# Patient Record
Sex: Female | Born: 1950
Health system: Southern US, Community
[De-identification: ages and names within clinical notes are randomized; demographics above are authoritative.]

## PROBLEM LIST (undated history)

## (undated) ENCOUNTER — Ambulatory Visit (HOSPITAL_COMMUNITY): Admission: EM | Disposition: A | Discharge status: Other Acute Inpatient Hospital | Payer: Medicare Other

## (undated) DIAGNOSIS — F418 Other specified anxiety disorders: Secondary | ICD-10-CM

## (undated) DIAGNOSIS — R040 Epistaxis: Secondary | ICD-10-CM

## (undated) DIAGNOSIS — L0390 Cellulitis, unspecified: Secondary | ICD-10-CM

## (undated) DIAGNOSIS — I2510 Atherosclerotic heart disease of native coronary artery without angina pectoris: Secondary | ICD-10-CM

## (undated) DIAGNOSIS — L209 Atopic dermatitis, unspecified: Secondary | ICD-10-CM

## (undated) DIAGNOSIS — Z72820 Sleep deprivation: Secondary | ICD-10-CM

## (undated) DIAGNOSIS — E039 Hypothyroidism, unspecified: Secondary | ICD-10-CM

## (undated) DIAGNOSIS — J439 Emphysema, unspecified: Secondary | ICD-10-CM

## (undated) DIAGNOSIS — R232 Flushing: Secondary | ICD-10-CM

## (undated) DIAGNOSIS — J309 Allergic rhinitis, unspecified: Secondary | ICD-10-CM

## (undated) DIAGNOSIS — F419 Anxiety disorder, unspecified: Secondary | ICD-10-CM

## (undated) DIAGNOSIS — Z860100 Personal history of colon polyps, unspecified: Secondary | ICD-10-CM

## (undated) DIAGNOSIS — E7800 Pure hypercholesterolemia, unspecified: Secondary | ICD-10-CM

## (undated) DIAGNOSIS — E559 Vitamin D deficiency, unspecified: Secondary | ICD-10-CM

## (undated) DIAGNOSIS — N200 Calculus of kidney: Secondary | ICD-10-CM

## (undated) DIAGNOSIS — R5382 Chronic fatigue, unspecified: Secondary | ICD-10-CM

## (undated) DIAGNOSIS — R600 Localized edema: Secondary | ICD-10-CM

## (undated) DIAGNOSIS — R079 Chest pain, unspecified: Secondary | ICD-10-CM

## (undated) DIAGNOSIS — K219 Gastro-esophageal reflux disease without esophagitis: Secondary | ICD-10-CM

## (undated) DIAGNOSIS — D509 Iron deficiency anemia, unspecified: Secondary | ICD-10-CM

## (undated) DIAGNOSIS — E669 Obesity, unspecified: Secondary | ICD-10-CM

## (undated) DIAGNOSIS — G2581 Restless legs syndrome: Secondary | ICD-10-CM

## (undated) DIAGNOSIS — R7303 Prediabetes: Secondary | ICD-10-CM

## (undated) DIAGNOSIS — I700 Atherosclerosis of aorta: Secondary | ICD-10-CM

## (undated) HISTORY — DX: Obesity, unspecified: E66.9

## (undated) HISTORY — DX: Atherosclerosis of aorta: I70.0

## (undated) HISTORY — DX: Pure hypercholesterolemia, unspecified: E78.00

## (undated) HISTORY — DX: Iron deficiency anemia, unspecified: D50.9

## (undated) HISTORY — DX: Other specified anxiety disorders: F41.8

## (undated) HISTORY — DX: Calculus of kidney: N20.0

## (undated) HISTORY — DX: Localized edema: R60.0

## (undated) HISTORY — DX: Hypothyroidism, unspecified: E03.9

## (undated) HISTORY — DX: Chest pain, unspecified: R07.9

## (undated) HISTORY — DX: Chronic fatigue, unspecified: R53.82

## (undated) HISTORY — DX: Atherosclerotic heart disease of native coronary artery without angina pectoris: I25.10

## (undated) HISTORY — DX: Sleep deprivation: Z72.820

## (undated) HISTORY — DX: Vitamin D deficiency, unspecified: E55.9

## (undated) HISTORY — DX: Emphysema, unspecified: J43.9

## (undated) HISTORY — DX: Flushing: R23.2

## (undated) HISTORY — DX: Epistaxis: R04.0

## (undated) HISTORY — DX: Prediabetes: R73.03

## (undated) HISTORY — DX: Anxiety disorder, unspecified: F41.9

## (undated) HISTORY — DX: Personal history of colonic polyps: Z86.010

## (undated) HISTORY — DX: Atopic dermatitis, unspecified: L20.9

## (undated) HISTORY — DX: Cellulitis, unspecified: L03.90

## (undated) HISTORY — DX: Allergic rhinitis, unspecified: J30.9

## (undated) HISTORY — DX: Gastro-esophageal reflux disease without esophagitis: K21.9

## (undated) HISTORY — DX: Personal history of colon polyps, unspecified: Z86.0100

## (undated) HISTORY — DX: Restless legs syndrome: G25.81

## (undated) HISTORY — PX: BREAST SURGERY: SHX581

## (undated) HISTORY — PX: CARPAL TUNNEL RELEASE: SHX101

---

## 1998-07-27 ENCOUNTER — Other Ambulatory Visit: Admission: RE | Admit: 1998-07-27 | Discharge: 1998-07-27 | Payer: Self-pay | Admitting: Oral Surgery

## 2001-09-11 ENCOUNTER — Other Ambulatory Visit: Admission: RE | Admit: 2001-09-11 | Discharge: 2001-09-11 | Payer: Self-pay | Admitting: Obstetrics and Gynecology

## 2002-12-23 HISTORY — PX: CARDIAC CATHETERIZATION: SHX172

## 2003-08-26 ENCOUNTER — Other Ambulatory Visit: Admission: RE | Admit: 2003-08-26 | Discharge: 2003-08-26 | Payer: Self-pay | Admitting: Oncology

## 2003-08-30 ENCOUNTER — Encounter: Admission: RE | Admit: 2003-08-30 | Discharge: 2003-08-30 | Payer: Self-pay | Admitting: Internal Medicine

## 2003-08-30 ENCOUNTER — Encounter: Payer: Self-pay | Admitting: Internal Medicine

## 2009-09-08 ENCOUNTER — Encounter: Admission: RE | Admit: 2009-09-08 | Discharge: 2009-09-08 | Payer: Self-pay | Admitting: Internal Medicine

## 2011-06-18 IMAGING — US US ABDOMEN COMPLETE
1 series · 14 of 25 positions shown · non-contrast
Comparison: None

CLINICAL DATA: Right upper quadrant pain.  Question gallstones.

ABDOMINAL ULTRASOUND COMPLETE

[Series 1: us abdomen complete · 0.26mm/px · 14 of 75 slices shown]
[im 1/75]
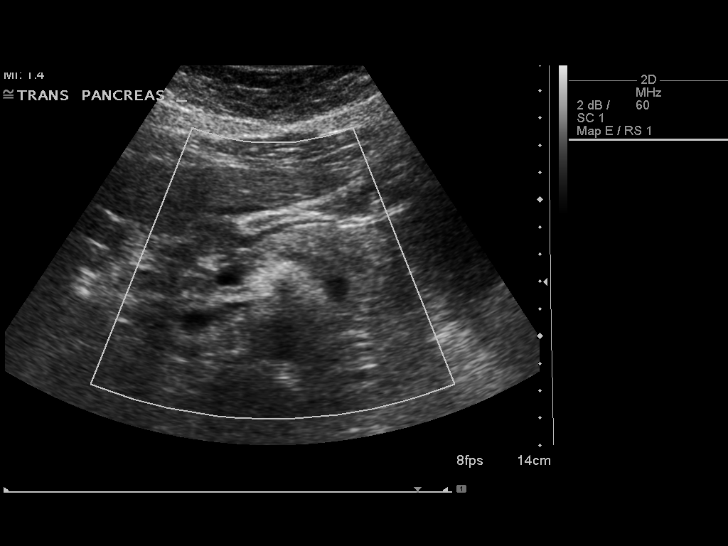
[im 7/75]
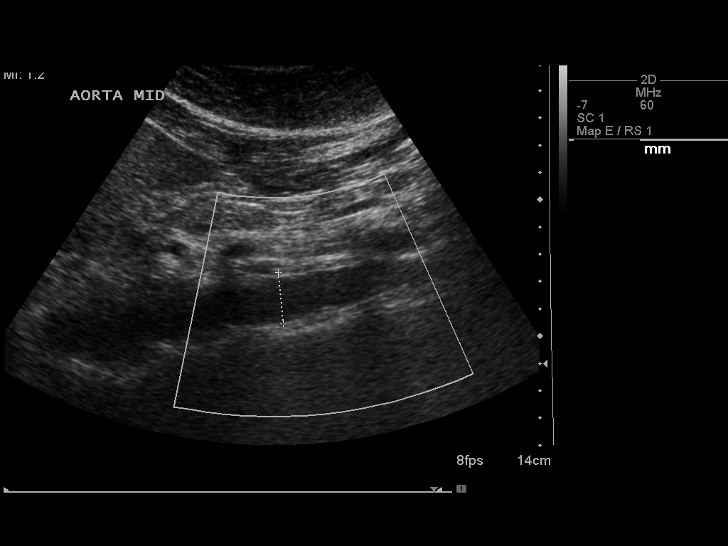
[im 13/75]
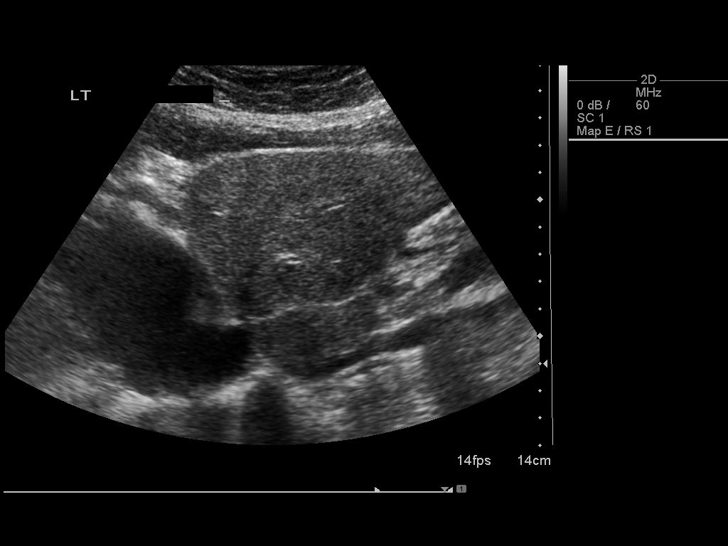
[im 19/75]
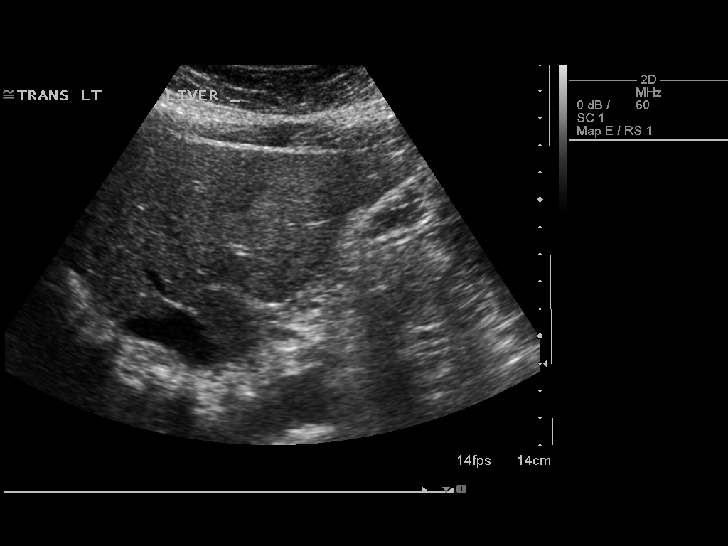
[im 25/75]
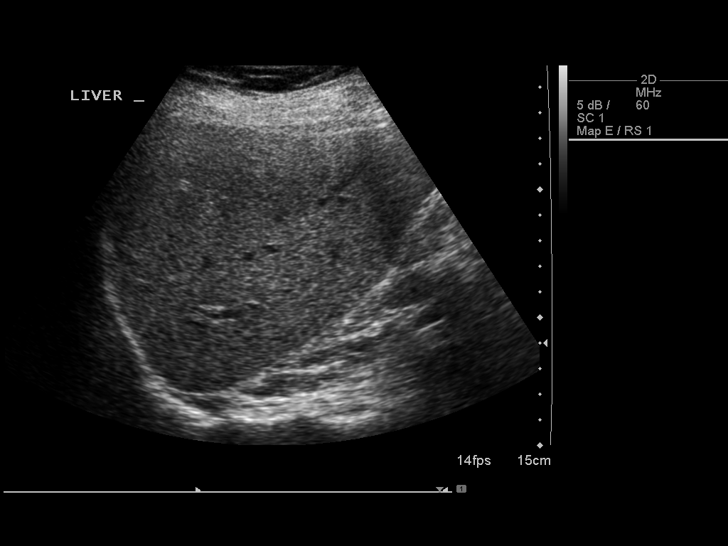
[im 28/75]
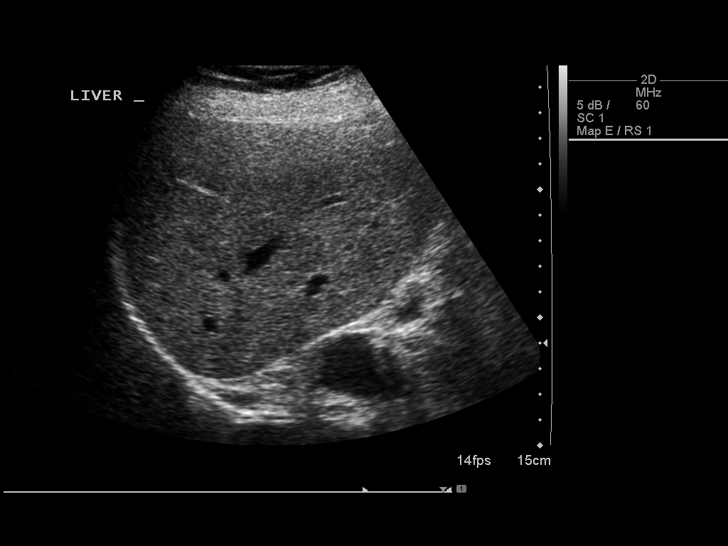
[im 34/75]
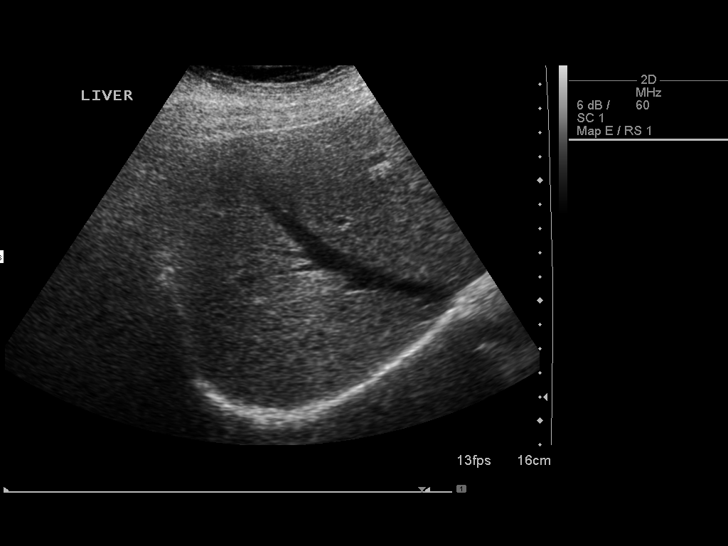
[im 41/75]
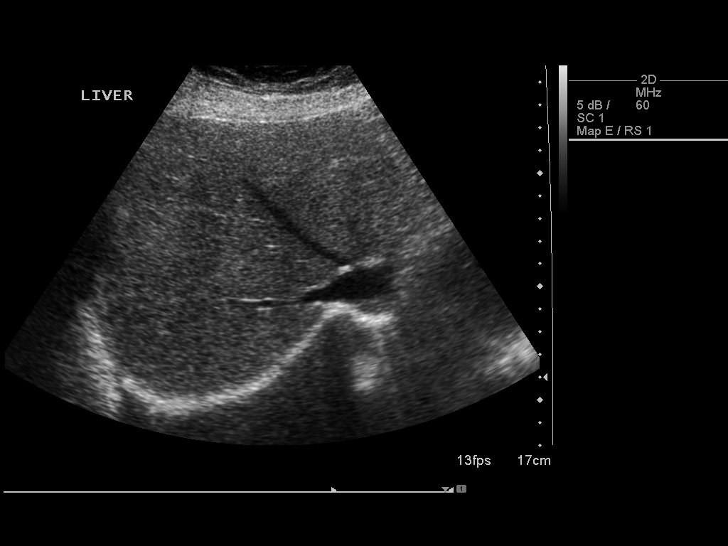
[im 47/75]
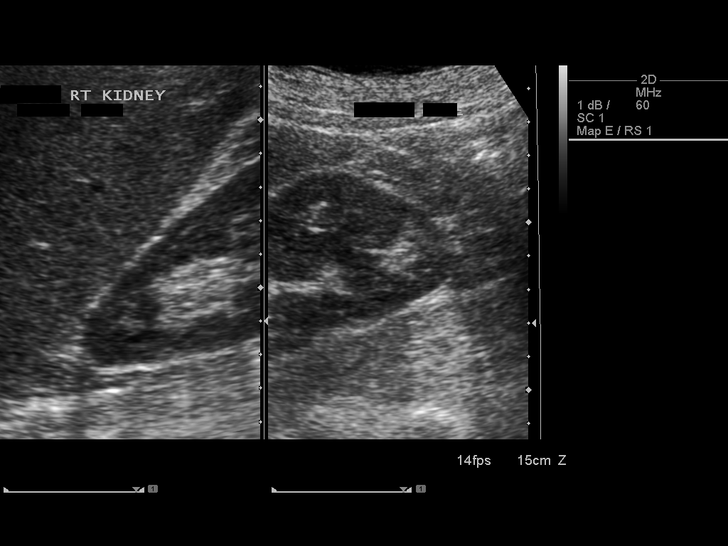
[im 50/75]
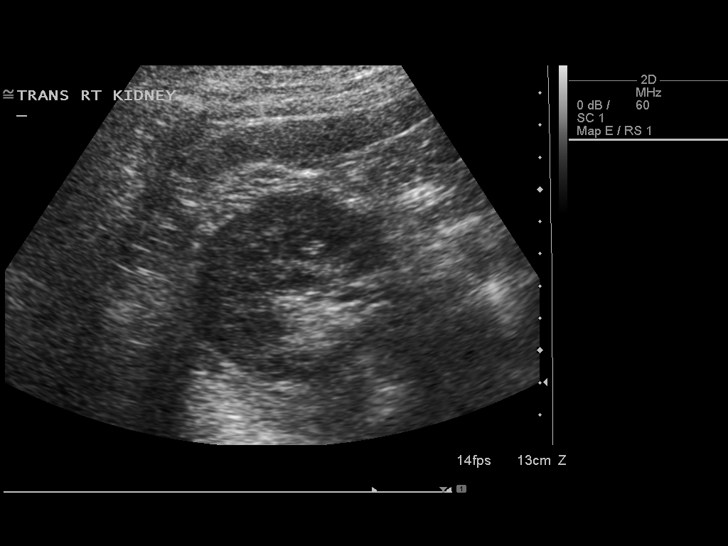
[im 56/75]
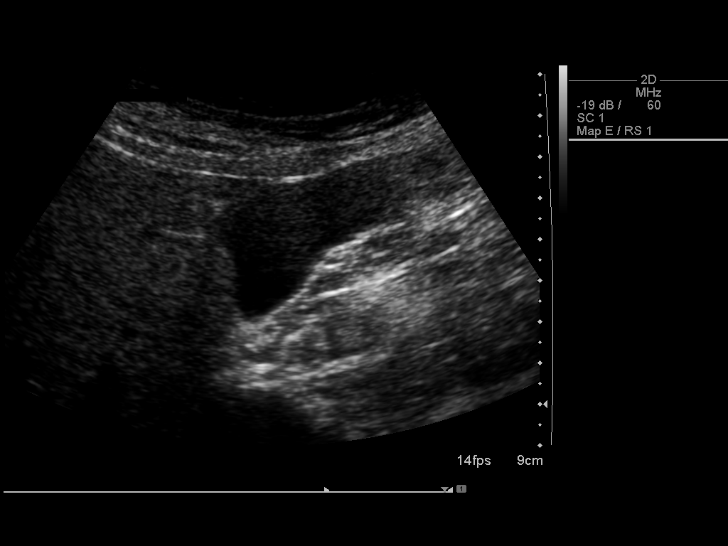
[im 62/75]
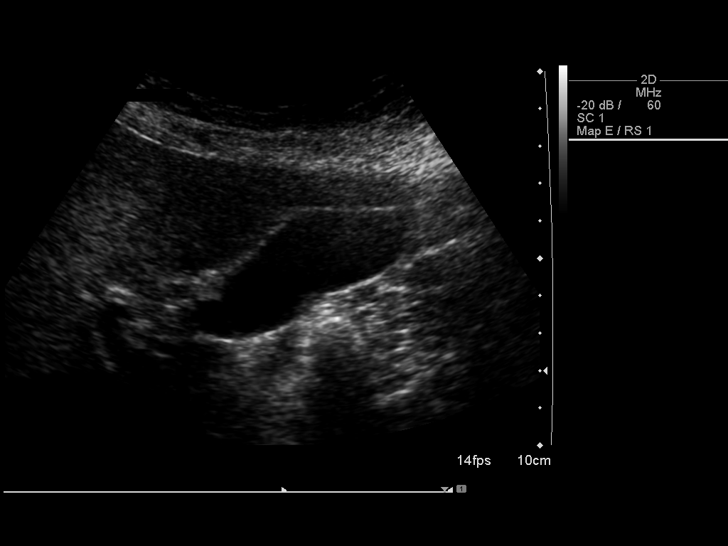
[im 68/75]
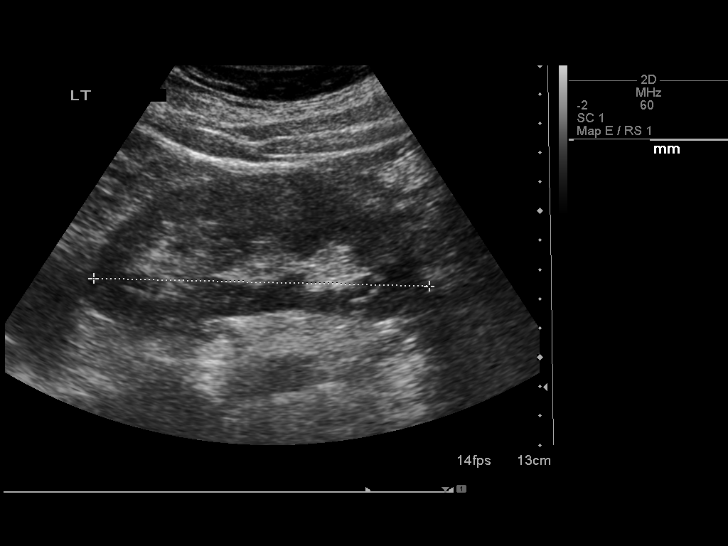
[im 75/75]
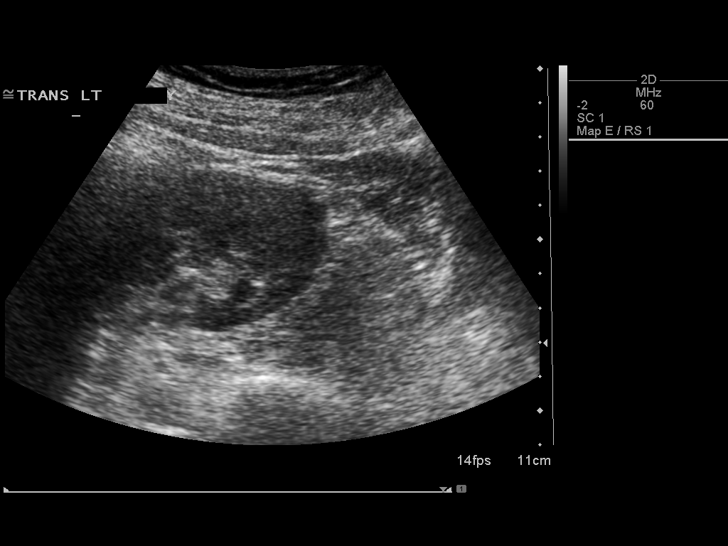

[14 of 25 positions shown; findings below may reference images not displayed]

FINDINGS: Gallbladder:  No gallstones, gallbladder wall thickening, or
pericholecystic fluid.  Wall thickness is 1.2 mm.

Common Bile Duct:  Within normal limits in caliber.  Common duct
diameter is 3.4 mm.

Liver:  No focal lesion identified.  Within normal limits in
parenchymal echogenicity.

IVC:  Appears normal.

Pancreas:  Although the pancreas is difficult to visualize in its
entirety, no focal pancreatic abnormality is identified.

Spleen:  Within normal limits in size and echotexture.  4.8 cm
length.

Right kidney:  Normal in size and parenchymal echogenicity.  No
evidence of mass or hydronephrosis.   10.8 cm length.

Left kidney:  Normal in size and parenchymal echogenicity.  No
evidence of mass or hydronephrosis.  11.5 cm length.

Abdominal Aorta:  No aneurysm identified.  2.2 cm maximum diameter.
IMPRESSION: Negative abdominal ultrasound.

## 2012-09-17 ENCOUNTER — Other Ambulatory Visit (HOSPITAL_COMMUNITY)
Admission: RE | Admit: 2012-09-17 | Discharge: 2012-09-17 | Disposition: A | Discharge status: Home / Self Care | Payer: BC Managed Care – PPO | Source: Ambulatory Visit | Attending: Internal Medicine | Admitting: Internal Medicine

## 2012-09-17 DIAGNOSIS — Z01419 Encounter for gynecological examination (general) (routine) without abnormal findings: Secondary | ICD-10-CM | POA: Insufficient documentation

## 2013-10-06 ENCOUNTER — Other Ambulatory Visit: Payer: Self-pay | Admitting: Internal Medicine

## 2013-10-06 DIAGNOSIS — R160 Hepatomegaly, not elsewhere classified: Secondary | ICD-10-CM

## 2013-10-11 ENCOUNTER — Ambulatory Visit
Admission: RE | Admit: 2013-10-11 | Discharge: 2013-10-11 | Disposition: A | Discharge status: Home / Self Care | Payer: 59 | Source: Ambulatory Visit | Attending: Internal Medicine | Admitting: Internal Medicine

## 2013-10-11 DIAGNOSIS — R160 Hepatomegaly, not elsewhere classified: Secondary | ICD-10-CM

## 2016-08-16 ENCOUNTER — Encounter (HOSPITAL_COMMUNITY): Payer: Self-pay | Admitting: Emergency Medicine

## 2016-08-16 ENCOUNTER — Emergency Department (HOSPITAL_COMMUNITY): Payer: No Typology Code available for payment source

## 2016-08-16 ENCOUNTER — Emergency Department (HOSPITAL_COMMUNITY): Payer: No Typology Code available for payment source | Admitting: Certified Registered Nurse Anesthetist

## 2016-08-16 ENCOUNTER — Other Ambulatory Visit: Payer: Self-pay | Admitting: Orthopedic Surgery

## 2016-08-16 ENCOUNTER — Emergency Department (HOSPITAL_COMMUNITY)
Admission: EM | Admit: 2016-08-16 | Discharge: 2016-08-16 | Disposition: A | Discharge status: Home / Self Care | Payer: No Typology Code available for payment source | Attending: Emergency Medicine | Admitting: Emergency Medicine

## 2016-08-16 ENCOUNTER — Encounter (HOSPITAL_COMMUNITY)
Admission: EM | Disposition: A | Discharge status: Home / Self Care | Payer: Self-pay | Source: Home / Self Care | Attending: Emergency Medicine

## 2016-08-16 DIAGNOSIS — S52572A Other intraarticular fracture of lower end of left radius, initial encounter for closed fracture: Secondary | ICD-10-CM | POA: Insufficient documentation

## 2016-08-16 DIAGNOSIS — S52502A Unspecified fracture of the lower end of left radius, initial encounter for closed fracture: Secondary | ICD-10-CM

## 2016-08-16 DIAGNOSIS — F329 Major depressive disorder, single episode, unspecified: Secondary | ICD-10-CM | POA: Insufficient documentation

## 2016-08-16 DIAGNOSIS — Z79899 Other long term (current) drug therapy: Secondary | ICD-10-CM | POA: Diagnosis not present

## 2016-08-16 DIAGNOSIS — R52 Pain, unspecified: Secondary | ICD-10-CM

## 2016-08-16 DIAGNOSIS — S52612A Displaced fracture of left ulna styloid process, initial encounter for closed fracture: Secondary | ICD-10-CM | POA: Insufficient documentation

## 2016-08-16 DIAGNOSIS — K219 Gastro-esophageal reflux disease without esophagitis: Secondary | ICD-10-CM | POA: Diagnosis not present

## 2016-08-16 DIAGNOSIS — G8918 Other acute postprocedural pain: Secondary | ICD-10-CM | POA: Diagnosis not present

## 2016-08-16 DIAGNOSIS — M25532 Pain in left wrist: Secondary | ICD-10-CM | POA: Diagnosis not present

## 2016-08-16 DIAGNOSIS — W108XXA Fall (on) (from) other stairs and steps, initial encounter: Secondary | ICD-10-CM | POA: Insufficient documentation

## 2016-08-16 DIAGNOSIS — S6990XA Unspecified injury of unspecified wrist, hand and finger(s), initial encounter: Secondary | ICD-10-CM | POA: Diagnosis present

## 2016-08-16 HISTORY — PX: OPEN REDUCTION INTERNAL FIXATION (ORIF) DISTAL RADIAL FRACTURE: SHX5989

## 2016-08-16 LAB — COMPREHENSIVE METABOLIC PANEL
ALT: 18 U/L (ref 14–54)
ANION GAP: 6 (ref 5–15)
AST: 24 U/L (ref 15–41)
Albumin: 3.9 g/dL (ref 3.5–5.0)
Alkaline Phosphatase: 74 U/L (ref 38–126)
BUN: 16 mg/dL (ref 6–20)
CHLORIDE: 104 mmol/L (ref 101–111)
CO2: 27 mmol/L (ref 22–32)
Calcium: 9.2 mg/dL (ref 8.9–10.3)
Creatinine, Ser: 0.69 mg/dL (ref 0.44–1.00)
Glucose, Bld: 114 mg/dL — ABNORMAL HIGH (ref 65–99)
POTASSIUM: 3.8 mmol/L (ref 3.5–5.1)
Sodium: 137 mmol/L (ref 135–145)
TOTAL PROTEIN: 6.6 g/dL (ref 6.5–8.1)
Total Bilirubin: 0.4 mg/dL (ref 0.3–1.2)

## 2016-08-16 LAB — CBC WITH DIFFERENTIAL/PLATELET
BASOS ABS: 0 10*3/uL (ref 0.0–0.1)
Basophils Relative: 0 %
EOS PCT: 1 %
Eosinophils Absolute: 0.1 10*3/uL (ref 0.0–0.7)
HCT: 37 % (ref 36.0–46.0)
Hemoglobin: 11.5 g/dL — ABNORMAL LOW (ref 12.0–15.0)
LYMPHS PCT: 17 %
Lymphs Abs: 1.3 10*3/uL (ref 0.7–4.0)
MCH: 28.5 pg (ref 26.0–34.0)
MCHC: 31.1 g/dL (ref 30.0–36.0)
MCV: 91.6 fL (ref 78.0–100.0)
MONO ABS: 0.3 10*3/uL (ref 0.1–1.0)
MONOS PCT: 4 %
Neutro Abs: 6 10*3/uL (ref 1.7–7.7)
Neutrophils Relative %: 78 %
PLATELETS: 239 10*3/uL (ref 150–400)
RBC: 4.04 MIL/uL (ref 3.87–5.11)
RDW: 13.6 % (ref 11.5–15.5)
WBC: 7.7 10*3/uL (ref 4.0–10.5)

## 2016-08-16 SURGERY — OPEN REDUCTION INTERNAL FIXATION (ORIF) DISTAL RADIUS FRACTURE
Anesthesia: Regional | Laterality: Left

## 2016-08-16 MED ORDER — MIDAZOLAM HCL 5 MG/5ML IJ SOLN
INTRAMUSCULAR | Status: DC | PRN
  Administered 2016-08-16: 2 mg via INTRAVENOUS

## 2016-08-16 MED ORDER — OXYCODONE-ACETAMINOPHEN 5-325 MG PO TABS: 2.0000 | ORAL_TABLET | ORAL | 0 refills | Status: DC | PRN

## 2016-08-16 MED ORDER — FENTANYL CITRATE (PF) 100 MCG/2ML IJ SOLN
INTRAMUSCULAR | Status: AC
  Filled 2016-08-16: qty 2

## 2016-08-16 MED ORDER — FENTANYL CITRATE (PF) 100 MCG/2ML IJ SOLN
INTRAMUSCULAR | Status: DC | PRN
  Administered 2016-08-16 (×3): 25 ug via INTRAVENOUS

## 2016-08-16 MED ORDER — CEFAZOLIN SODIUM-DEXTROSE 2-3 GM-% IV SOLR
INTRAVENOUS | Status: DC | PRN
  Administered 2016-08-16: 2 g via INTRAVENOUS

## 2016-08-16 MED ORDER — OXYCODONE-ACETAMINOPHEN 5-325 MG PO TABS: 1.0000 | ORAL_TABLET | ORAL | 0 refills | Status: DC | PRN

## 2016-08-16 MED ORDER — PROPOFOL 10 MG/ML IV BOLUS
INTRAVENOUS | Status: AC
Start: 2016-08-16 — End: 2016-08-16
  Filled 2016-08-16: qty 20

## 2016-08-16 MED ORDER — CEFAZOLIN SODIUM-DEXTROSE 2-4 GM/100ML-% IV SOLN
INTRAVENOUS | Status: AC
  Filled 2016-08-16: qty 100

## 2016-08-16 MED ORDER — LACTATED RINGERS IV SOLN
INTRAVENOUS | Status: DC | PRN
  Administered 2016-08-16 (×2): via INTRAVENOUS

## 2016-08-16 MED ORDER — PHENYLEPHRINE HCL 10 MG/ML IJ SOLN
INTRAMUSCULAR | Status: DC | PRN
  Administered 2016-08-16 (×2): 160 ug via INTRAVENOUS
  Administered 2016-08-16: 80 ug via INTRAVENOUS

## 2016-08-16 MED ORDER — ACETAMINOPHEN 325 MG PO TABS
650.0000 mg | ORAL_TABLET | Freq: Once | ORAL | Status: AC
  Administered 2016-08-16: 650 mg via ORAL
  Filled 2016-08-16: qty 2

## 2016-08-16 MED ORDER — MIDAZOLAM HCL 2 MG/2ML IJ SOLN
INTRAMUSCULAR | Status: AC
Start: 2016-08-16 — End: 2016-08-16
  Filled 2016-08-16: qty 2

## 2016-08-16 MED ORDER — ONDANSETRON HCL 4 MG/2ML IJ SOLN
INTRAMUSCULAR | Status: AC
  Filled 2016-08-16: qty 2

## 2016-08-16 MED ORDER — PROPOFOL 10 MG/ML IV BOLUS
INTRAVENOUS | Status: DC | PRN
  Administered 2016-08-16: 180 mg via INTRAVENOUS

## 2016-08-16 MED ORDER — LIDOCAINE HCL (CARDIAC) 20 MG/ML IV SOLN
INTRAVENOUS | Status: DC | PRN
  Administered 2016-08-16: 100 mg via INTRAVENOUS

## 2016-08-16 MED ORDER — PROMETHAZINE HCL 25 MG/ML IJ SOLN: 6.2500 mg | INTRAMUSCULAR | Status: DC | PRN

## 2016-08-16 MED ORDER — LACTATED RINGERS IV SOLN
INTRAVENOUS | Status: DC
  Administered 2016-08-16: 14:00:00 via INTRAVENOUS

## 2016-08-16 MED ORDER — FENTANYL CITRATE (PF) 100 MCG/2ML IJ SOLN: 25.0000 ug | INTRAMUSCULAR | Status: DC | PRN

## 2016-08-16 MED ORDER — 0.9 % SODIUM CHLORIDE (POUR BTL) OPTIME
TOPICAL | Status: DC | PRN
  Administered 2016-08-16: 1000 mL

## 2016-08-16 MED ORDER — DEXAMETHASONE SODIUM PHOSPHATE 10 MG/ML IJ SOLN
INTRAMUSCULAR | Status: DC | PRN
  Administered 2016-08-16: 5 mg via INTRAVENOUS

## 2016-08-16 MED ORDER — BUPIVACAINE HCL (PF) 0.25 % IJ SOLN
INTRAMUSCULAR | Status: DC | PRN
  Administered 2016-08-16: 8 mg

## 2016-08-16 MED ORDER — BUPIVACAINE HCL (PF) 0.25 % IJ SOLN
INTRAMUSCULAR | Status: AC
  Filled 2016-08-16: qty 30

## 2016-08-16 MED ORDER — MIDAZOLAM HCL 2 MG/2ML IJ SOLN
INTRAMUSCULAR | Status: AC
  Filled 2016-08-16: qty 2

## 2016-08-16 MED ORDER — DEXAMETHASONE SODIUM PHOSPHATE 10 MG/ML IJ SOLN
INTRAMUSCULAR | Status: AC
  Filled 2016-08-16: qty 1

## 2016-08-16 MED ORDER — ONDANSETRON HCL 4 MG/2ML IJ SOLN
INTRAMUSCULAR | Status: DC | PRN
  Administered 2016-08-16: 4 mg via INTRAVENOUS

## 2016-08-16 SURGICAL SUPPLY — 62 items
BANDAGE ACE 3X5.8 VEL STRL LF (GAUZE/BANDAGES/DRESSINGS) ×3 IMPLANT
BANDAGE ACE 4X5 VEL STRL LF (GAUZE/BANDAGES/DRESSINGS) ×3 IMPLANT
BANDAGE ELASTIC 3 VELCRO ST LF (GAUZE/BANDAGES/DRESSINGS) ×3 IMPLANT
BIT DRILL 2 FAST STEP (BIT) ×3 IMPLANT
BIT DRILL 2.5X4 QC (BIT) ×3 IMPLANT
BNDG CMPR 9X4 STRL LF SNTH (GAUZE/BANDAGES/DRESSINGS) ×1
BNDG ESMARK 4X9 LF (GAUZE/BANDAGES/DRESSINGS) ×3 IMPLANT
BNDG GAUZE ELAST 4 BULKY (GAUZE/BANDAGES/DRESSINGS) ×6 IMPLANT
CORDS BIPOLAR (ELECTRODE) IMPLANT
COVER SURGICAL LIGHT HANDLE (MISCELLANEOUS) ×3 IMPLANT
CUFF TOURNIQUET SINGLE 18IN (TOURNIQUET CUFF) ×3 IMPLANT
DECANTER SPIKE VIAL GLASS SM (MISCELLANEOUS) ×3 IMPLANT
DRAPE C-ARM MINI 42X72 WSTRAPS (DRAPES) ×3 IMPLANT
DRAPE SURG 17X23 STRL (DRAPES) ×3 IMPLANT
DURAPREP 26ML APPLICATOR (WOUND CARE) ×3 IMPLANT
ELECT REM PT RETURN 9FT ADLT (ELECTROSURGICAL)
ELECTRODE REM PT RTRN 9FT ADLT (ELECTROSURGICAL) IMPLANT
GAUZE SPONGE 4X4 12PLY STRL (GAUZE/BANDAGES/DRESSINGS) ×3 IMPLANT
GAUZE XEROFORM 1X8 LF (GAUZE/BANDAGES/DRESSINGS) ×3 IMPLANT
GLOVE SURG SS PI 6.5 STRL IVOR (GLOVE) ×9 IMPLANT
GLOVE SURG SYN 8.0 (GLOVE) ×3 IMPLANT
GOWN STRL REUS W/ TWL LRG LVL3 (GOWN DISPOSABLE) ×1 IMPLANT
GOWN STRL REUS W/ TWL XL LVL3 (GOWN DISPOSABLE) ×1 IMPLANT
GOWN STRL REUS W/TWL LRG LVL3 (GOWN DISPOSABLE) ×3
GOWN STRL REUS W/TWL XL LVL3 (GOWN DISPOSABLE) ×3
KIT BASIN OR (CUSTOM PROCEDURE TRAY) ×3 IMPLANT
KIT ROOM TURNOVER OR (KITS) ×3 IMPLANT
MANIFOLD NEPTUNE II (INSTRUMENTS) ×3 IMPLANT
NEEDLE HYPO 25GX1X1/2 BEV (NEEDLE) IMPLANT
NS IRRIG 1000ML POUR BTL (IV SOLUTION) ×3 IMPLANT
PACK ORTHO EXTREMITY (CUSTOM PROCEDURE TRAY) ×3 IMPLANT
PAD ARMBOARD 7.5X6 YLW CONV (MISCELLANEOUS) ×6 IMPLANT
PAD CAST 3X4 CTTN HI CHSV (CAST SUPPLIES) ×1 IMPLANT
PAD CAST 4YDX4 CTTN HI CHSV (CAST SUPPLIES) ×1 IMPLANT
PADDING CAST COTTON 3X4 STRL (CAST SUPPLIES) ×3
PADDING CAST COTTON 4X4 STRL (CAST SUPPLIES) ×3
PEG SUBCHONDRAL SMOOTH 2.0X20 (Peg) ×6 IMPLANT
PEG SUBCHONDRAL SMOOTH 2.0X22 (Peg) ×9 IMPLANT
PEG THREADED 2.5MMX20MM LONG (Peg) ×6 IMPLANT
PEG THREADED 2.5MMX22MM LONG (Peg) ×3 IMPLANT
PENCIL BUTTON HOLSTER BLD 10FT (ELECTRODE) IMPLANT
PLATE STAN 24.4X59.5 LT (Plate) ×3 IMPLANT
SCREW BN 12X3.5XNS CORT TI (Screw) ×1 IMPLANT
SCREW BN 13X3.5XNS CORT TI (Screw) ×1 IMPLANT
SCREW CORT 3.5X10 LNG (Screw) ×3 IMPLANT
SCREW CORT 3.5X12 (Screw) ×3 IMPLANT
SCREW CORT 3.5X13 (Screw) ×3 IMPLANT
SPLINT FIBERGLASS 3X12 (CAST SUPPLIES) ×3 IMPLANT
SPONGE GAUZE 4X4 12PLY STER LF (GAUZE/BANDAGES/DRESSINGS) ×3 IMPLANT
SPONGE LAP 4X18 X RAY DECT (DISPOSABLE) IMPLANT
SPONGE SCRUB IODOPHOR (GAUZE/BANDAGES/DRESSINGS) ×3 IMPLANT
SUT PROLENE 3 0 PS 1 (SUTURE) ×3 IMPLANT
SUT PROLENE 3 0 PS 2 (SUTURE) IMPLANT
SUT VIC AB 2-0 CT1 18 (SUTURE) ×3 IMPLANT
SUT VIC AB 3-0 FS2 27 (SUTURE) IMPLANT
SUT VICRYL 4-0 PS2 18IN ABS (SUTURE) IMPLANT
SYR CONTROL 10ML LL (SYRINGE) ×3 IMPLANT
TOWEL OR 17X24 6PK STRL BLUE (TOWEL DISPOSABLE) ×3 IMPLANT
TOWEL OR 17X26 10 PK STRL BLUE (TOWEL DISPOSABLE) ×3 IMPLANT
TUBE CONNECTING 12'X1/4 (SUCTIONS) ×1
TUBE CONNECTING 12X1/4 (SUCTIONS) ×2 IMPLANT
UNDERPAD 30X30 (UNDERPADS AND DIAPERS) ×3 IMPLANT

## 2016-08-16 NOTE — ED Notes (Signed)
PA at bedside.

## 2016-08-16 NOTE — Transfer of Care (Signed)
Immediate Anesthesia Transfer of Care Note  Patient: Danielle Stuart  Procedure(s) Performed: Procedure(s): OPEN REDUCTION INTERNAL FIXATION (ORIF) LEFT DISTAL RADIAL FRACTURE (Left)  Patient Location: PACU  Anesthesia Type:General and Regional  Level of Consciousness: awake, alert , oriented and patient cooperative  Airway & Oxygen Therapy: Patient Spontanous Breathing and Patient connected to nasal cannula oxygen  Post-op Assessment: Report given to RN, Post -op Vital signs reviewed and stable and Patient moving all extremities  Post vital signs: Reviewed and stable  Last Vitals:  Vitals:   08/16/16 1035 08/16/16 1231  BP: 117/56 122/65  Pulse: 75 78  Resp: 18 16  Temp:  36.9 C    Last Pain:  Vitals:   08/16/16 1231  TempSrc: Oral  PainSc:          Complications: No apparent anesthesia complications

## 2016-08-16 NOTE — H&P (Signed)
Danielle Stuart is an 65 y.o. female.   Chief Complaint: left wrist pain HPI: as above s/p foosh with displaced left distal radius fracture  History reviewed. No pertinent past medical history.  Past Surgical History:  Procedure Laterality Date  . BREAST SURGERY     1970's- biopsies of breast- benign   . CARDIAC CATHETERIZATION  2004   told that the results were normal  . CARPAL TUNNEL RELEASE Bilateral     No family history on file. Social History:  reports that she has never smoked. She uses smokeless tobacco. She reports that she does not drink alcohol. Her drug history is not on file.  Allergies:  Allergies  Allergen Reactions  . Codeine   . Cocaine Other (See Comments)    unknown  . Latex Other (See Comments)    Skin flare up    Medications Prior to Admission  Medication Sig Dispense Refill  . Calcium Citrate (CITRACAL PO) Take 1 tablet by mouth daily.    . Cholecalciferol (VITAMIN D3) 2000 units TABS Take 2,000 Units by mouth daily.    . cycloSPORINE (RESTASIS) 0.05 % ophthalmic emulsion Place 1 drop into both eyes 2 (two) times daily.    . furosemide (LASIX) 40 MG tablet Take 20 mg by mouth daily.    Marland Kitchen omeprazole (PRILOSEC) 20 MG capsule Take 20 mg by mouth daily.  0  . sertraline (ZOLOFT) 100 MG tablet Take 100 mg by mouth daily.  0    Results for orders placed or performed during the hospital encounter of 08/16/16 (from the past 48 hour(s))  CBC with Differential/Platelet     Status: Abnormal   Collection Time: 08/16/16 10:00 AM  Result Value Ref Range   WBC 7.7 4.0 - 10.5 K/uL   RBC 4.04 3.87 - 5.11 MIL/uL   Hemoglobin 11.5 (L) 12.0 - 15.0 g/dL   HCT 37.0 36.0 - 46.0 %   MCV 91.6 78.0 - 100.0 fL   MCH 28.5 26.0 - 34.0 pg   MCHC 31.1 30.0 - 36.0 g/dL   RDW 13.6 11.5 - 15.5 %   Platelets 239 150 - 400 K/uL   Neutrophils Relative % 78 %   Neutro Abs 6.0 1.7 - 7.7 K/uL   Lymphocytes Relative 17 %   Lymphs Abs 1.3 0.7 - 4.0 K/uL   Monocytes Relative 4 %   Monocytes Absolute 0.3 0.1 - 1.0 K/uL   Eosinophils Relative 1 %   Eosinophils Absolute 0.1 0.0 - 0.7 K/uL   Basophils Relative 0 %   Basophils Absolute 0.0 0.0 - 0.1 K/uL  Comprehensive metabolic panel     Status: Abnormal   Collection Time: 08/16/16 10:00 AM  Result Value Ref Range   Sodium 137 135 - 145 mmol/L   Potassium 3.8 3.5 - 5.1 mmol/L   Chloride 104 101 - 111 mmol/L   CO2 27 22 - 32 mmol/L   Glucose, Bld 114 (H) 65 - 99 mg/dL   BUN 16 6 - 20 mg/dL   Creatinine, Ser 0.69 0.44 - 1.00 mg/dL   Calcium 9.2 8.9 - 10.3 mg/dL   Total Protein 6.6 6.5 - 8.1 g/dL   Albumin 3.9 3.5 - 5.0 g/dL   AST 24 15 - 41 U/L   ALT 18 14 - 54 U/L   Alkaline Phosphatase 74 38 - 126 U/L   Total Bilirubin 0.4 0.3 - 1.2 mg/dL   GFR calc non Af Amer >60 >60 mL/min   GFR calc Af Amer >  60 >60 mL/min    Comment: (NOTE) The eGFR has been calculated using the CKD EPI equation. This calculation has not been validated in all clinical situations. eGFR's persistently <60 mL/min signify possible Chronic Kidney Disease.    Anion gap 6 5 - 15   Dg Chest 1 View  Result Date: 08/16/2016 CLINICAL DATA:  Preop left wrist fracture. EXAM: CHEST 1 VIEW COMPARISON:  PA view of the chest earlier today. FINDINGS: Lateral view demonstrates normal heart size. No layering effusions. No visible focal airspace opacity or acute bony abnormality. IMPRESSION: No active disease. Electronically Signed   By: Rolm Baptise M.D.   On: 08/16/2016 10:28   Dg Ribs Unilateral W/chest Left  Result Date: 08/16/2016 CLINICAL DATA:  [place] down stairs this morning. Left rib pain. Left wrist fracture. Initial encounter. EXAM: LEFT RIBS AND CHEST - 3+ VIEW COMPARISON:  None. FINDINGS: No fracture or other bone lesions are seen involving the ribs. There is no evidence of pneumothorax or pleural effusion. Both lungs are clear. Heart size and mediastinal contours are within normal limits. Aortic atherosclerosis. IMPRESSION: No acute findings.   Aortic atherosclerosis. Electronically Signed   By: Earle Gell M.D.   On: 08/16/2016 08:16   Dg Forearm Left  Result Date: 08/16/2016 CLINICAL DATA:  Fall down stairs. Left forearm pain and deformity. Initial encounter. EXAM: LEFT FOREARM - 2 VIEW COMPARISON:  None. FINDINGS: Comminuted fracture of distal radius is seen. Displaced fracture ulnar styloid process also demonstrated. No fractures of the proximal radius or ulna identified. IMPRESSION: Comminuted fracture of distal radius, with distal ulnar styloid process fracture. No proximal forearm fracture again Electronically Signed   By: Earle Gell M.D.   On: 08/16/2016 08:11   Dg Wrist Complete Left  Result Date: 08/16/2016 CLINICAL DATA:  [place] down stairs this morning. Left wrist pain and deformity. Initial encounter. EXAM: LEFT WRIST - COMPLETE 3+ VIEW COMPARISON:  None. FINDINGS: A comminuted fracture of the distal radius is seen with intra-articular extension to the radiocarpal joint and possibly distal radial ulnar joint as well. There is mild impaction and dorsal angulation of the distal articular surface the radius. Ulnar styloid process also noted. Carpal bones remain normal in appearance alignment. IMPRESSION: Comminuted distal radial fracture, with intra-articular extension, mild impaction and dorsal angulation. Ulnar styloid process fracture. Electronically Signed   By: Earle Gell M.D.   On: 08/16/2016 08:10   Dg Hand Complete Left  Result Date: 08/16/2016 CLINICAL DATA:  [place] down stairs. Left hand pain, swelling, and deformity. Initial encounter. EXAM: LEFT HAND - COMPLETE 3+ VIEW COMPARISON:  Left wrist radiographs also obtained today FINDINGS: Fractures of the distal radius and ulnar styloid process are again seen, as better demonstrated on recent wrist radiographs. No other fractures are seen involving the bones of the hand. No evidence of dislocation. IMPRESSION: No fractures involving hand. Distal radius and ulnar styloid process  fractures again demonstrated; see separate wrist radiograph report. Electronically Signed   By: Earle Gell M.D.   On: 08/16/2016 08:13    Review of Systems  All other systems reviewed and are negative.   Blood pressure 122/65, pulse 78, temperature 98.5 F (36.9 C), temperature source Oral, resp. rate 16, height _0  (1.6 m), weight 81.6 kg (180 lb), SpO2 95 %. Physical Exam  Constitutional: She is oriented to person, place, and time. She appears well-developed and well-nourished.  HENT:  Head: Normocephalic and atraumatic.  Neck: Normal range of motion.  Cardiovascular: Normal rate.  Respiratory: Effort normal.  Musculoskeletal:       Left wrist: She exhibits tenderness, bony tenderness, swelling and deformity.  Displaced left distal radius fracture  Neurological: She is alert and oriented to person, place, and time.  Skin: Skin is warm.  Psychiatric: She has a normal mood and affect. Her behavior is normal. Judgment and thought content normal.     Assessment/Plan As above  Plan  ORIF  Schuyler Amor, MD 08/16/2016, 2:04 PM

## 2016-08-16 NOTE — ED Notes (Signed)
Ortho Tech at bedside.  

## 2016-08-16 NOTE — Anesthesia Preprocedure Evaluation (Addendum)
Anesthesia Evaluation  Patient identified by MRN, date of birth, ID band Patient awake    Reviewed: Allergy & Precautions, NPO status , Patient's Chart, lab work & pertinent test results  Airway Mallampati: II  TM Distance: >3 FB Neck ROM: Full    Dental   Pulmonary neg pulmonary ROS,    Pulmonary exam normal        Cardiovascular negative cardio ROS Normal cardiovascular exam     Neuro/Psych PSYCHIATRIC DISORDERS Depression negative neurological ROS     GI/Hepatic Neg liver ROS, GERD  Medicated,  Endo/Other  negative endocrine ROS  Renal/GU negative Renal ROS     Musculoskeletal negative musculoskeletal ROS (+)   Abdominal   Peds  Hematology negative hematology ROS (+)   Anesthesia Other Findings Day of surgery medications reviewed with the patient.  Reproductive/Obstetrics                            Anesthesia Physical Anesthesia Plan  ASA: I  Anesthesia Plan: General and Regional   Post-op Pain Management: GA combined w/ Regional for post-op pain   Induction: Intravenous  Airway Management Planned: LMA  Additional Equipment:   Intra-op Plan:   Post-operative Plan: Extubation in OR  Informed Consent: I have reviewed the patients History and Physical, chart, labs and discussed the procedure including the risks, benefits and alternatives for the proposed anesthesia with the patient or authorized representative who has indicated his/her understanding and acceptance.   Dental advisory given  Plan Discussed with:   Anesthesia Plan Comments:         Anesthesia Quick Evaluation

## 2016-08-16 NOTE — Anesthesia Procedure Notes (Signed)
Procedure Name: LMA Insertion Date/Time: 08/16/2016 2:47 PM Performed by: Coralee RudFLORES, Nelton Amsden Pre-anesthesia Checklist: Patient identified, Emergency Drugs available, Suction available and Patient being monitored Patient Re-evaluated:Patient Re-evaluated prior to inductionOxygen Delivery Method: Circle system utilized Preoxygenation: Pre-oxygenation with 100% oxygen Intubation Type: IV induction Ventilation: Mask ventilation without difficulty LMA Size: 4.0 Number of attempts: 1 Placement Confirmation: positive ETCO2 Tube secured with: Tape Dental Injury: Teeth and Oropharynx as per pre-operative assessment

## 2016-08-16 NOTE — Op Note (Signed)
See note [phone]

## 2016-08-16 NOTE — ED Notes (Signed)
Spoke with otho tech regarding slint

## 2016-08-16 NOTE — ED Provider Notes (Signed)
[facility]EMER[facility]ENCY DEPT Provider Note   CSN: [id number] Arrival date & time: 08/16/16  [id number]     History   Chief Complaint Chief Complaint  Patient presents with  . Fall  . Wrist Injury    HPI Danielle Stuart is a 65 y.o. female.  The history is provided by the patient. No language interpreter was used.  Fall  This is a new problem. The current episode started 1 to 2 hours ago. The problem occurs constantly. The problem has not changed since onset.Pertinent negatives include no chest pain and no shortness of breath. Nothing aggravates the symptoms. Nothing relieves the symptoms. She has tried nothing for the symptoms. The treatment provided no relief.  Wrist Injury    Pt complains of falling down a step and hitting her knees and then landing on her wrist.  Pt complains of swelling and pain in her wrist.  Pt reports pain in left ribs.  Pt did not hit her head. No los of conciousness  History reviewed. No pertinent past medical history.  There are no active problems to display for this patient.   Past Surgical History:  Procedure Laterality Date  . CARPAL TUNNEL RELEASE      OB History    No data available       Home Medications    Prior to Admission medications   Not on File    Family History No family history on file.  Social History Social History  Substance Use Topics  . Smoking status: Never Smoker  . Smokeless tobacco: Current User  . Alcohol use No     Allergies   Cocaine   Review of Systems Review of Systems  Respiratory: Negative for shortness of breath.   Cardiovascular: Negative for chest pain.  All other systems reviewed and are negative.    Physical Exam Updated Vital Signs BP (!) 125/54 (BP Location: Right Arm)   Pulse 67   Temp 97.7 F (36.5 C)   Resp 17   Ht 5\' 3"  (1.6 m)   Wt 81.6 kg   SpO2 98%   BMI 31.89 kg/m   Physical Exam  Constitutional: She appears well-developed and well-nourished.  HENT:  Head: Normocephalic.    Right Ear: External ear normal.  Left Ear: External ear normal.  Nose: Nose normal.  Mouth/Throat: Oropharynx is clear and moist.  Eyes: Conjunctivae and EOM are normal. Pupils are equal, round, and reactive to light.  Neck: Normal range of motion.  Cardiovascular: Normal rate.   Pulmonary/Chest: Effort normal and breath sounds normal. She exhibits tenderness.  Tender left ribs  Abdominal: Soft.  Musculoskeletal: She exhibits edema, tenderness and deformity.  Neurological: She is alert. She has normal reflexes.  Skin: Skin is warm.  Psychiatric: She has a normal mood and affect.  Nursing note and vitals reviewed.    ED Treatments / Results  Labs (all labs ordered are listed, but only abnormal results are displayed) Labs Reviewed - No data to display  EKG  EKG Interpretation None       Radiology Dg Ribs Unilateral W/chest Left  Result Date: 08/16/2016 CLINICAL DATA:  Larey SeatFell down stairs this morning. Left rib pain. Left wrist fracture. Initial encounter. EXAM: LEFT RIBS AND CHEST - 3+ VIEW COMPARISON:  None. FINDINGS: No fracture or other bone lesions are seen involving the ribs. There is no evidence of pneumothorax or pleural effusion. Both lungs are clear. Heart size and mediastinal contours are within normal limits. Aortic atherosclerosis. IMPRESSION: No acute findings.  Aortic atherosclerosis. Electronically Signed   By: Myles Rosenthal M.D.   On: 08/16/2016 08:16   Dg Forearm Left  Result Date: 08/16/2016 CLINICAL DATA:  Fall down stairs. Left forearm pain and deformity. Initial encounter. EXAM: LEFT FOREARM - 2 VIEW COMPARISON:  None. FINDINGS: Comminuted fracture of distal radius is seen. Displaced fracture ulnar styloid process also demonstrated. No fractures of the proximal radius or ulna identified. IMPRESSION: Comminuted fracture of distal radius, with distal ulnar styloid process fracture. No proximal forearm fracture again Electronically Signed   By: Myles Rosenthal M.D.   On:  08/16/2016 08:11   Dg Wrist Complete Left  Result Date: 08/16/2016 CLINICAL DATA:  Larey Seat down stairs this morning. Left wrist pain and deformity. Initial encounter. EXAM: LEFT WRIST - COMPLETE 3+ VIEW COMPARISON:  None. FINDINGS: A comminuted fracture of the distal radius is seen with intra-articular extension to the radiocarpal joint and possibly distal radial ulnar joint as well. There is mild impaction and dorsal angulation of the distal articular surface the radius. Ulnar styloid process also noted. Carpal bones remain normal in appearance alignment. IMPRESSION: Comminuted distal radial fracture, with intra-articular extension, mild impaction and dorsal angulation. Ulnar styloid process fracture. Electronically Signed   By: Myles Rosenthal M.D.   On: 08/16/2016 08:10   Dg Hand Complete Left  Result Date: 08/16/2016 CLINICAL DATA:  Larey Seat down stairs. Left hand pain, swelling, and deformity. Initial encounter. EXAM: LEFT HAND - COMPLETE 3+ VIEW COMPARISON:  Left wrist radiographs also obtained today FINDINGS: Fractures of the distal radius and ulnar styloid process are again seen, as better demonstrated on recent wrist radiographs. No other fractures are seen involving the bones of the hand. No evidence of dislocation. IMPRESSION: No fractures involving hand. Distal radius and ulnar styloid process fractures again demonstrated; see separate wrist radiograph report. Electronically Signed   By: Myles Rosenthal M.D.   On: 08/16/2016 08:13    Procedures Procedures (including critical care time)  Medications Ordered in ED Medications - No data to display   Initial Impression / Assessment and Plan / ED Course  I have reviewed the triage vital signs and the nursing notes.  Pertinent labs & imaging results that were available during my care of the patient were reviewed by me and considered in my medical decision making (see chart for details).  Clinical Course  Value Comment By Time  DG Wrist Complete Left  (Reviewed) Elson Areas, PA-C 08/25 4098    I spoke to Sidonie Dickens Pa working with Dr. Mina Marble today.   He reviewed xrays.  He advised office will call pt today to schedule surgery.  ICe, elevate, splint.    Change of plan.   Dr. Mina Marble advised hold pt for surgery at 2:00 here today. Pt not given RX or discharge instructions.  Chest xray, labs and EKG ordered.  Final Clinical Impressions(s) / ED Diagnoses   Final diagnoses:  Distal radius fracture, left, closed, initial encounter    New Prescriptions New Prescriptions   OXYCODONE-ACETAMINOPHEN (PERCOCET/ROXICET) 5-325 MG TABLET    Take 2 tablets by mouth every 4 (four) hours as needed for severe pain.     Elson Areas, PA-C 08/16/16 0920    Elson Areas, PA-C 08/16/16 1191    Loren Racer, MD 08/21/16 [phone]

## 2016-08-16 NOTE — Anesthesia Procedure Notes (Addendum)
Anesthesia Regional Block:  Supraclavicular block  Pre-Anesthetic Checklist: ,, timeout performed, Correct Patient, Correct Site, Correct Laterality, Correct Procedure, Correct Position, site marked, Risks and benefits discussed,  Surgical consent,  Pre-op evaluation,  At surgeon's request and post-op pain management  Laterality: Left  Prep: chloraprep       Needles:  Injection technique: Single-shot  Needle Type: Echogenic Needle     Needle Length: 5cm 5 cm Needle Gauge: 22 and 22 G    Additional Needles:  Procedures: ultrasound guided (picture in chart) Supraclavicular block Narrative:  Start time: 08/16/2016 2:31 PM End time: 08/16/2016 2:37 PM Injection made incrementally with aspirations every 25 mL.  Performed by: Personally  Anesthesiologist: Bonita QuinGUIDETTI, Jakyra Kenealy S  Additional Notes: Patient tolerated procedure well.

## 2016-08-16 NOTE — ED Triage Notes (Signed)
Pt. missed her step and fell on her left side this morning , denies LOC , ambulatory , presents with left wrist swelling/deformity . Pain radiating to forearm .

## 2016-08-16 NOTE — Progress Notes (Signed)
Orthopedic Tech Progress Note Patient Details:  [place] DragonFrances C Pompa Aug 12, 1951 [id number]  Ortho Devices Type of Ortho Device: Ace wrap, Sugartong splint Ortho Device/Splint Location: lue Ortho Device/Splint Interventions: Application   Angeletta Goelz 08/16/2016, 9:56 AM

## 2016-08-17 NOTE — Op Note (Signed)
NAMJenna Stuart:  Danielle Stuart               ACCOUNT NO.:  [id number]  MEDICAL RECORD NO.:  [id number]  LOCATION:  [facility]                         FACILITY:  [facility]  PHYSICIAN:  Artist PaisMatthew A. Islah Eve, M.D.DATE OF BIRTH:  09/08/1951  DATE OF PROCEDURE:  08/16/2016 DATE OF DISCHARGE:  08/16/2016                              OPERATIVE REPORT   PREOPERATIVE DIAGNOSIS:  Displaced intra-articular fracture, 4-part distal radius, left side.  POSTOPERATIVE DIAGNOSIS:  Displaced intra-articular fracture, 4-part distal radius, left side.  PROCEDURE:  Open reduction and internal fixation, left distal radius fracture, 4-part.  SURGEON:  Artist PaisMatthew A. Mina MarbleWeingold, MD.  ASSISTANT:  Danielle Reeksobert J. Dasnoit, PA.  ANESTHESIA:  Axillary block and general.  COMPLICATIONS:  No complication.  DRAINS:  No drains.  DESCRIPTION OF PROCEDURE:  The patient was taken to the operating suite after induction of adequate IV block analgesia and then axillary block analgesia and general laryngeal mask airway anesthetic.  The left upper extremity prepped and draped in sterile fashion.  An Esmarch was used to exsanguinate the limb.  The tourniquet was then inflated to 250 mmHg. At this point in time, incision was made on the palmar aspect of the distal aspect of the left forearm and wrist centered over the flexor carpi radialis tendon.  Skin was incised sharply and the sheath overlying the FCR was incised.  The FCR was retracted to midline, radial artery to lateral side.  The fascia was incised.  Dissection was carried down to the level of pronator quadratus.  We subperiosteally stripped the pronator quadratus off the fracture site revealing a 4-part intra- articular fracture with comminution.  We carefully released the brachioradialis off the distal fragment to aid in reduction.  Reduction was performed with flexion, ulnar deviation, and slight traction.  We placed a standard DVR plate palmarly, fixed with a screw through  the slotted hole, and used fluoroscopy to determine adequate plate position. Once this was done, 2 more cortical screws were placed proximally followed by the smooth pegs distally x5 and 2 partially-threaded screws into the radial styloid fragment.  At the end of the procedure, intraoperative fluoro imaging, AP, lateral, and oblique showed reduction of the fracture in all 3 planes, good placement of the hardware.  The wound was irrigated.  We closed the pronator quadratus over the plate with 2-0 undyed Vicryl.  The skin and soft tissue subcutaneously with 4-0 Vicryl and a 3-0 Prolene subcuticular stitch on the skin.  Steri-Strips, 4 x 4s fluffs, and a volar splint was applied. The patient tolerated the procedure well and went to recovery room in stable fashion.     Artist PaisMatthew A. Mina MarbleWeingold, M.D.     MAW/MEDQ  D:  08/16/2016  T:  08/17/2016  Job:  [id number]

## 2016-08-19 ENCOUNTER — Encounter (HOSPITAL_COMMUNITY): Payer: Self-pay | Admitting: Orthopedic Surgery

## 2016-08-19 NOTE — Anesthesia Postprocedure Evaluation (Signed)
Anesthesia Post Note  Patient: Danielle Stuart  Procedure(s) Performed: Procedure(s) (LRB): OPEN REDUCTION INTERNAL FIXATION (ORIF) LEFT DISTAL RADIAL FRACTURE (Left)  Patient location during evaluation: PACU Anesthesia Type: General Level of consciousness: awake and alert Pain management: pain level controlled Vital Signs Assessment: post-procedure vital signs reviewed and stable Respiratory status: spontaneous breathing, nonlabored ventilation, respiratory function stable and patient connected to nasal cannula oxygen Cardiovascular status: blood pressure returned to baseline and stable Postop Assessment: no signs of nausea or vomiting Anesthetic complications: no    Last Vitals:  Vitals:   08/16/16 1635 08/16/16 1650  BP: 125/62   Pulse: 79   Resp: 12   Temp:  36.9 C    Last Pain:  Vitals:   08/16/16 1231  TempSrc: Oral  PainSc:                  [name]

## 2016-08-22 DIAGNOSIS — S52532D Colles' fracture of left radius, subsequent encounter for closed fracture with routine healing: Secondary | ICD-10-CM | POA: Insufficient documentation

## 2016-11-21 DIAGNOSIS — M7502 Adhesive capsulitis of left shoulder: Secondary | ICD-10-CM | POA: Insufficient documentation

## 2017-05-22 DIAGNOSIS — H25812 Combined forms of age-related cataract, left eye: Secondary | ICD-10-CM | POA: Diagnosis not present

## 2017-05-22 DIAGNOSIS — H2512 Age-related nuclear cataract, left eye: Secondary | ICD-10-CM | POA: Diagnosis not present

## 2017-06-27 DIAGNOSIS — M7062 Trochanteric bursitis, left hip: Secondary | ICD-10-CM | POA: Diagnosis not present

## 2017-06-27 DIAGNOSIS — M545 Low back pain: Secondary | ICD-10-CM | POA: Diagnosis not present

## 2017-12-24 DIAGNOSIS — H02413 Mechanical ptosis of bilateral eyelids: Secondary | ICD-10-CM | POA: Diagnosis not present

## 2017-12-24 DIAGNOSIS — Z01818 Encounter for other preprocedural examination: Secondary | ICD-10-CM | POA: Diagnosis not present

## 2018-05-04 DIAGNOSIS — H52203 Unspecified astigmatism, bilateral: Secondary | ICD-10-CM | POA: Diagnosis not present

## 2018-05-04 DIAGNOSIS — Z961 Presence of intraocular lens: Secondary | ICD-10-CM | POA: Diagnosis not present

## 2018-05-04 DIAGNOSIS — H5000 Unspecified esotropia: Secondary | ICD-10-CM | POA: Diagnosis not present

## 2018-05-26 IMAGING — DX DG HAND COMPLETE 3+V*L*
3 series · 3 of 3 positions shown · non-contrast
Comparison: Left wrist radiographs also obtained today

CLINICAL DATA: Fell down stairs. Left hand pain, swelling, and
deformity. Initial encounter.

EXAM:
LEFT HAND - COMPLETE 3+ VIEW

[x hand lat left]
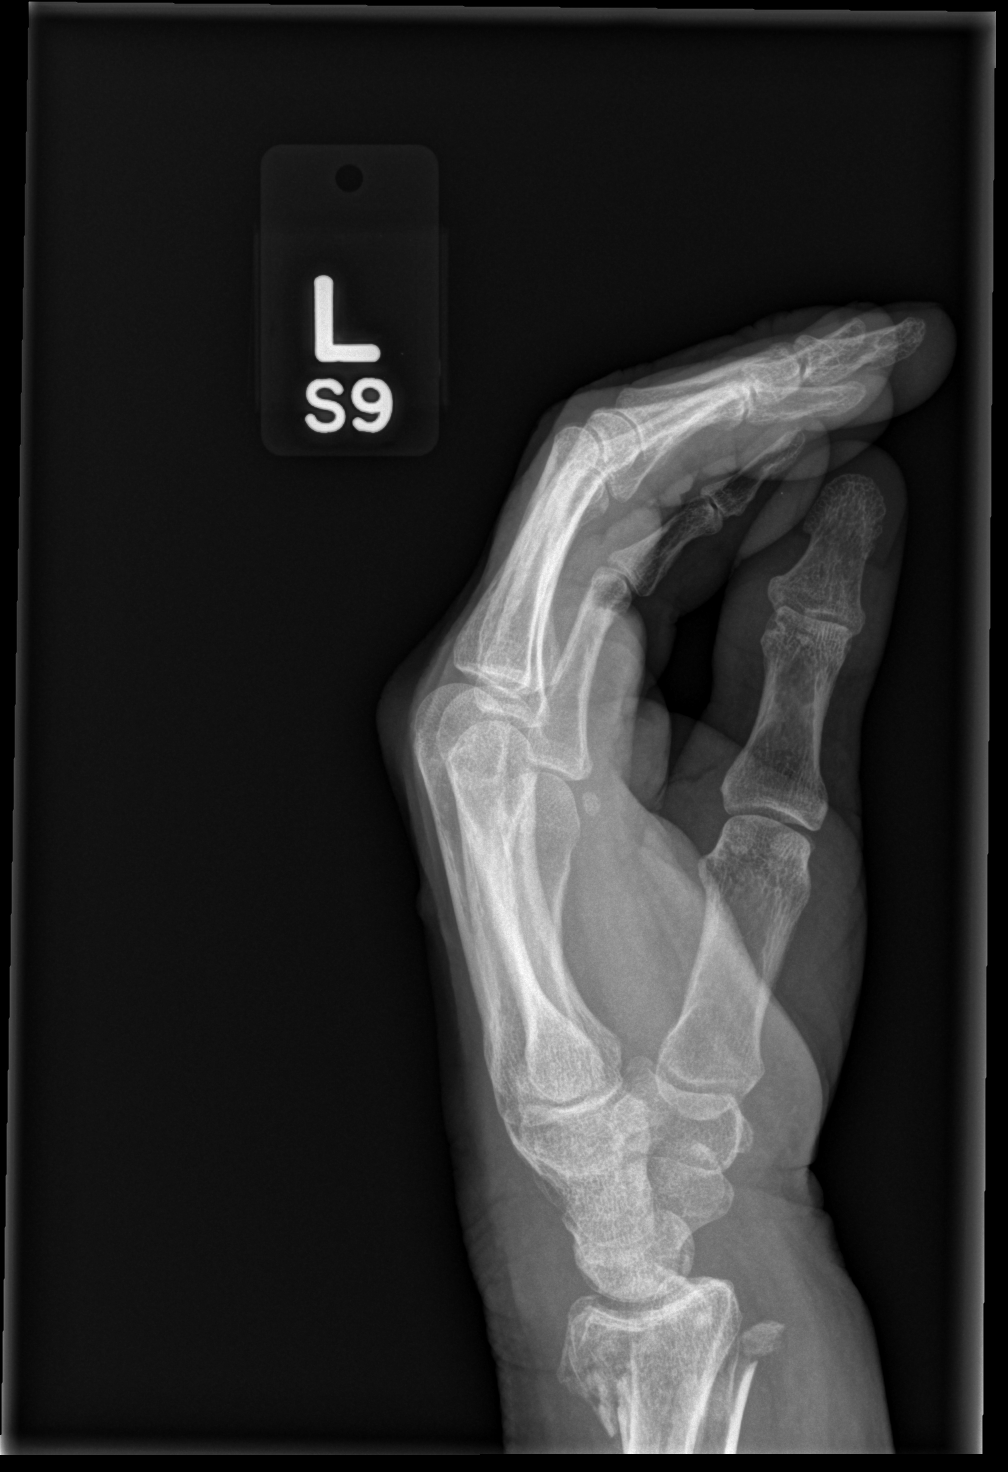

[x hand pa left]
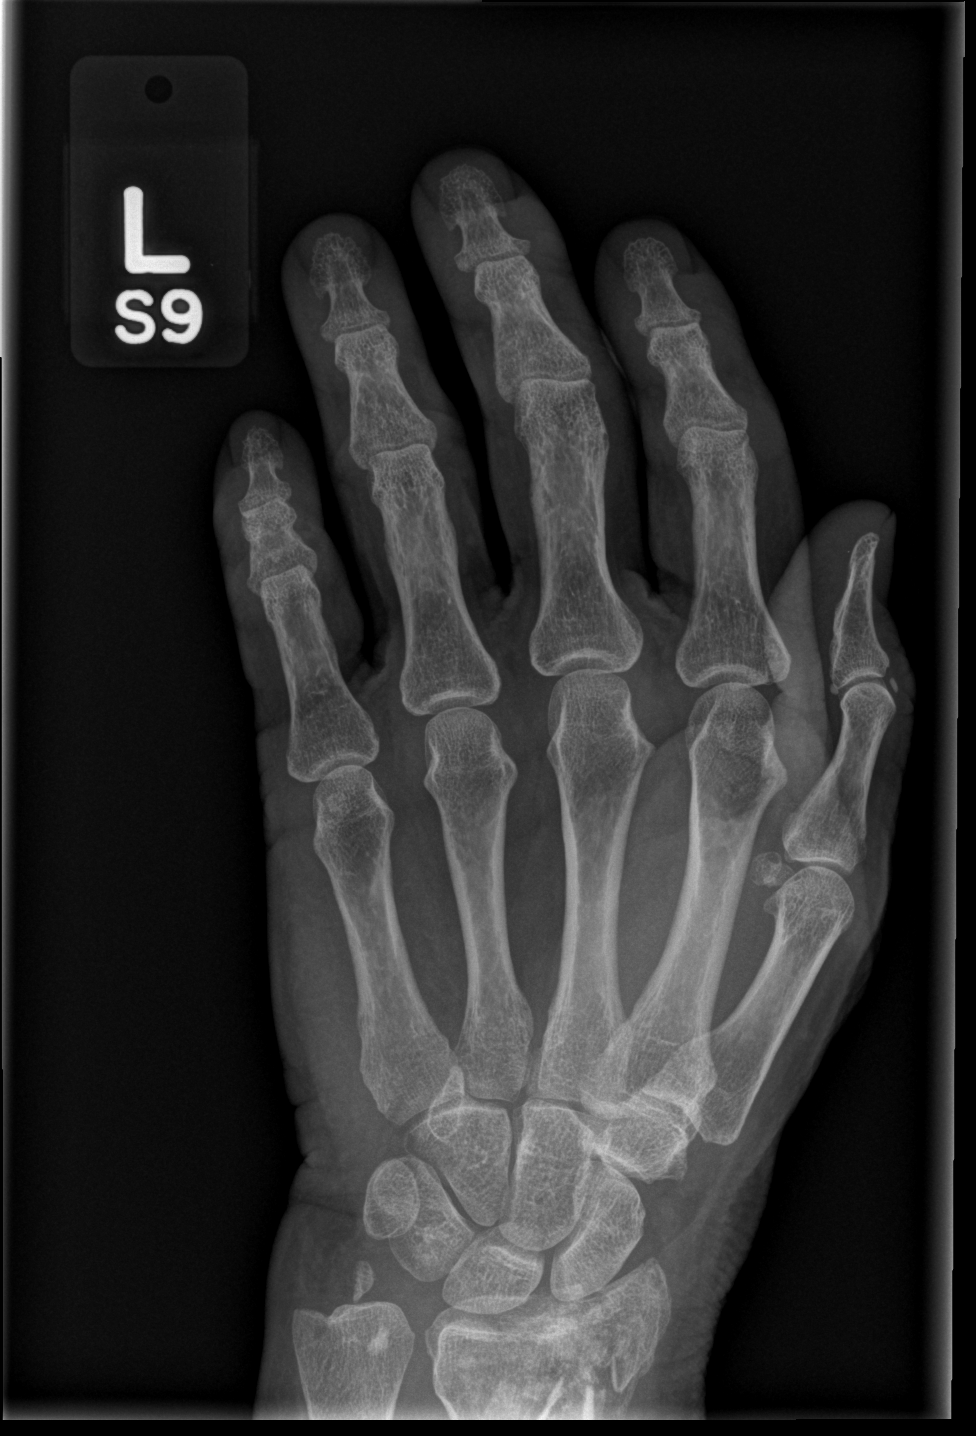

[x hand obl left]
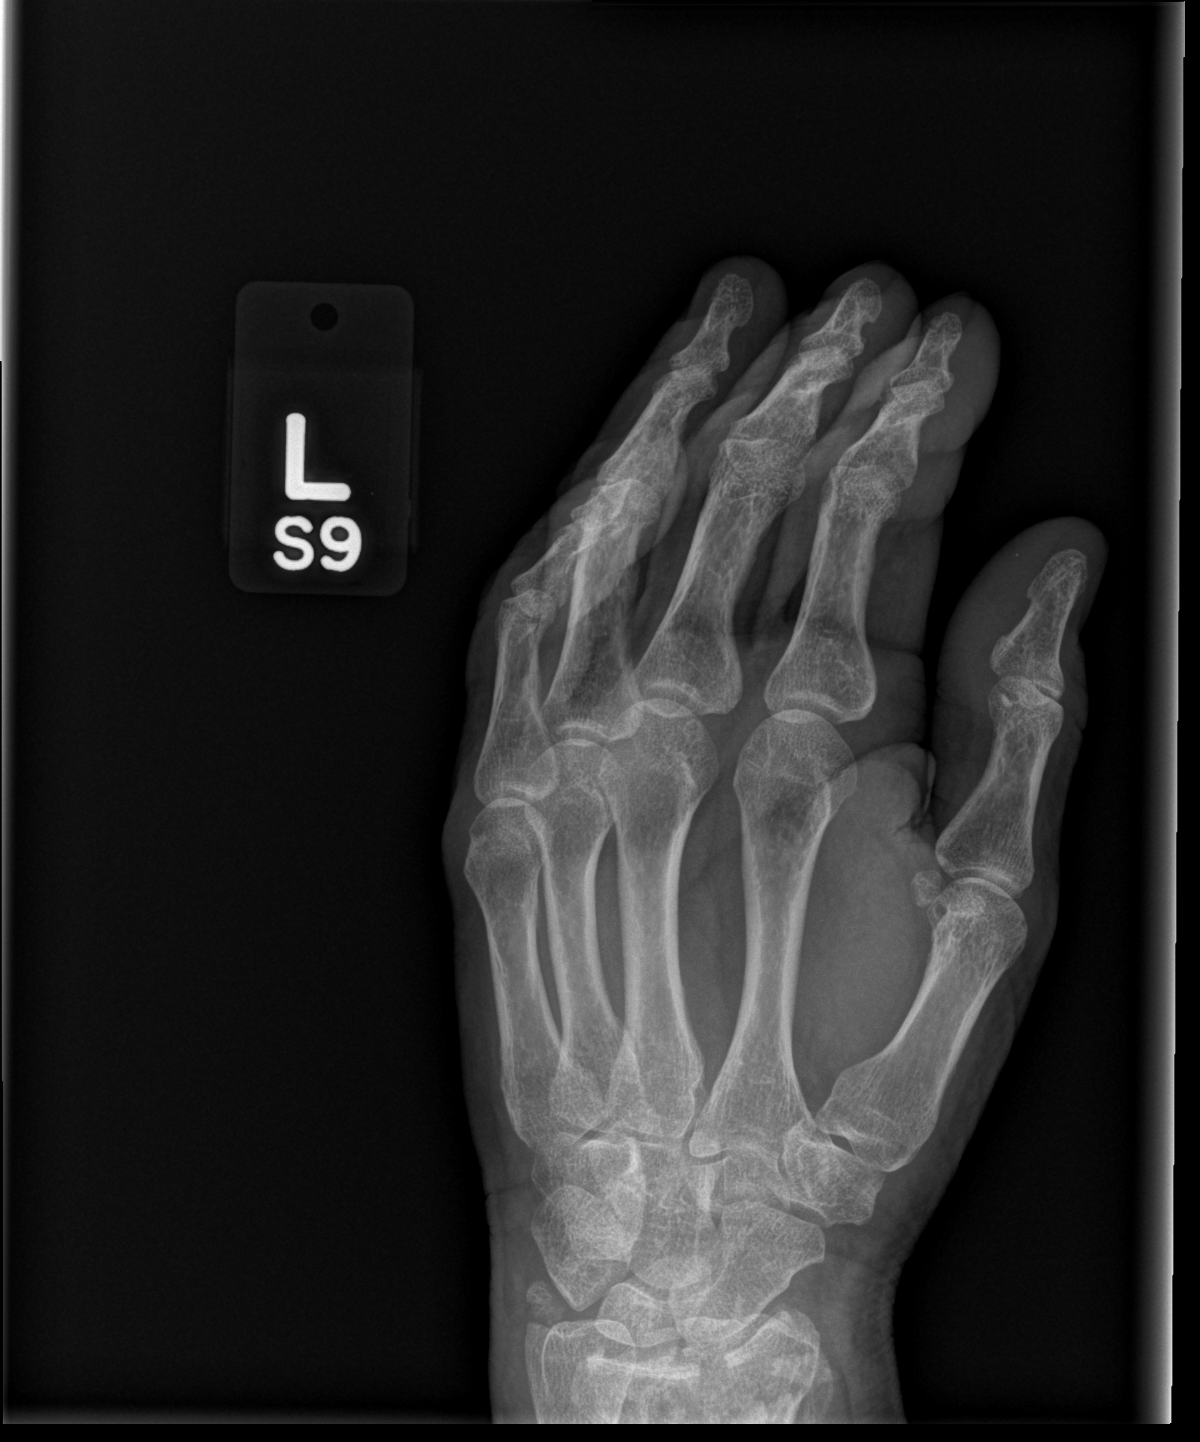

[3 of 3 positions shown; findings below may reference images not displayed]

FINDINGS: Fractures of the distal radius and ulnar styloid process are again
seen, as better demonstrated on recent wrist radiographs. No other
fractures are seen involving the bones of the hand. No evidence of
dislocation.
IMPRESSION: No fractures involving hand. Distal radius and ulnar styloid process
fractures again demonstrated; see separate wrist radiograph report.

## 2018-05-26 IMAGING — DX DG CHEST 1V
1 series · 1 of 1 positions shown · non-contrast
Comparison: PA view of the chest earlier today.

CLINICAL DATA: Preop left wrist fracture.

EXAM:
CHEST 1 VIEW

[w chest lat]
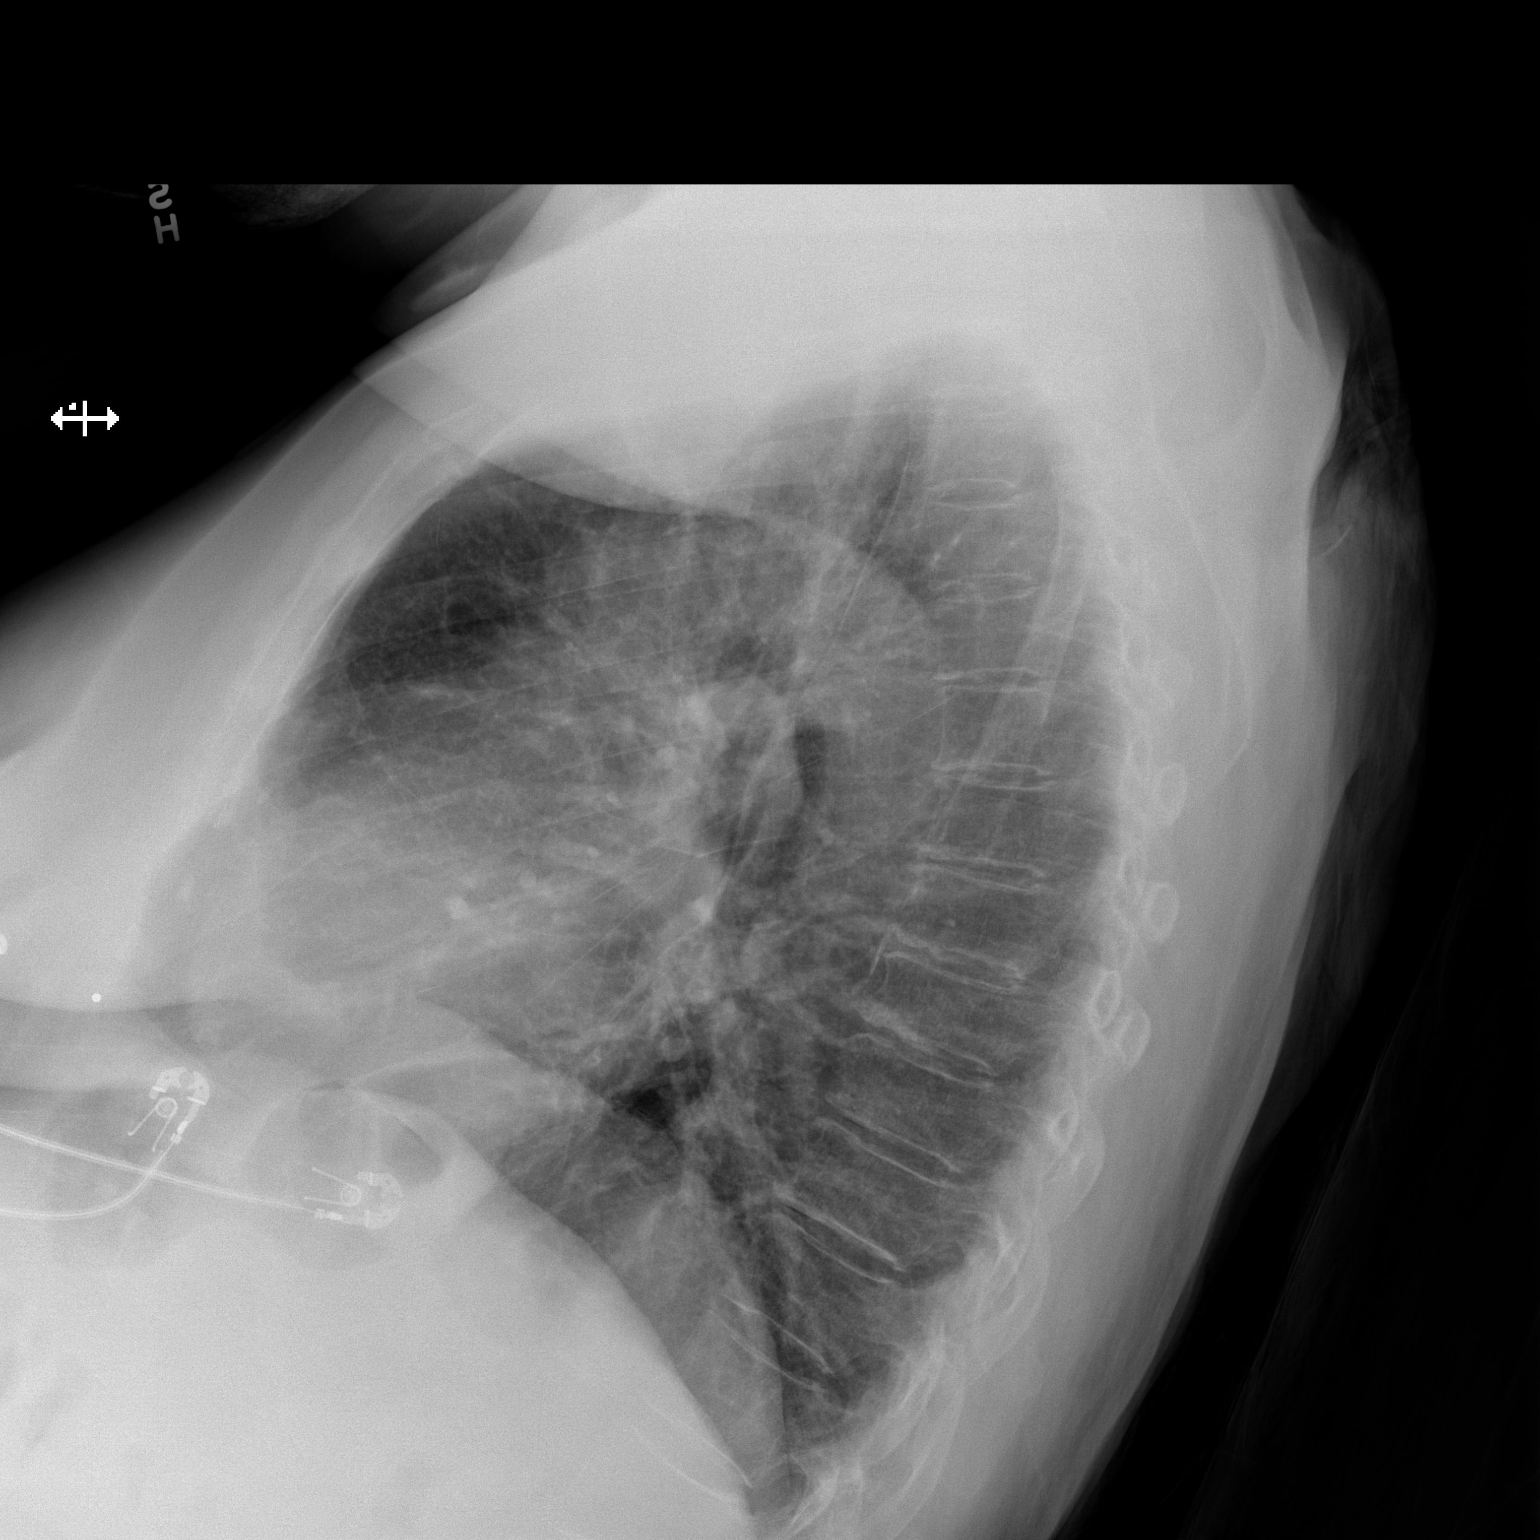

[1 of 1 positions shown; findings below may reference images not displayed]

FINDINGS: Lateral view demonstrates normal heart size. No layering effusions.
No visible focal airspace opacity or acute bony abnormality.
IMPRESSION: No active disease.

## 2018-05-26 IMAGING — DX DG RIBS W/ CHEST 3+V*L*
3 series · 3 of 3 positions shown · non-contrast
Comparison: None.

CLINICAL DATA: Fell down stairs this morning. Left rib pain. Left
wrist fracture. Initial encounter.

EXAM:
LEFT RIBS AND CHEST - 3+ VIEW

[w chest pa]
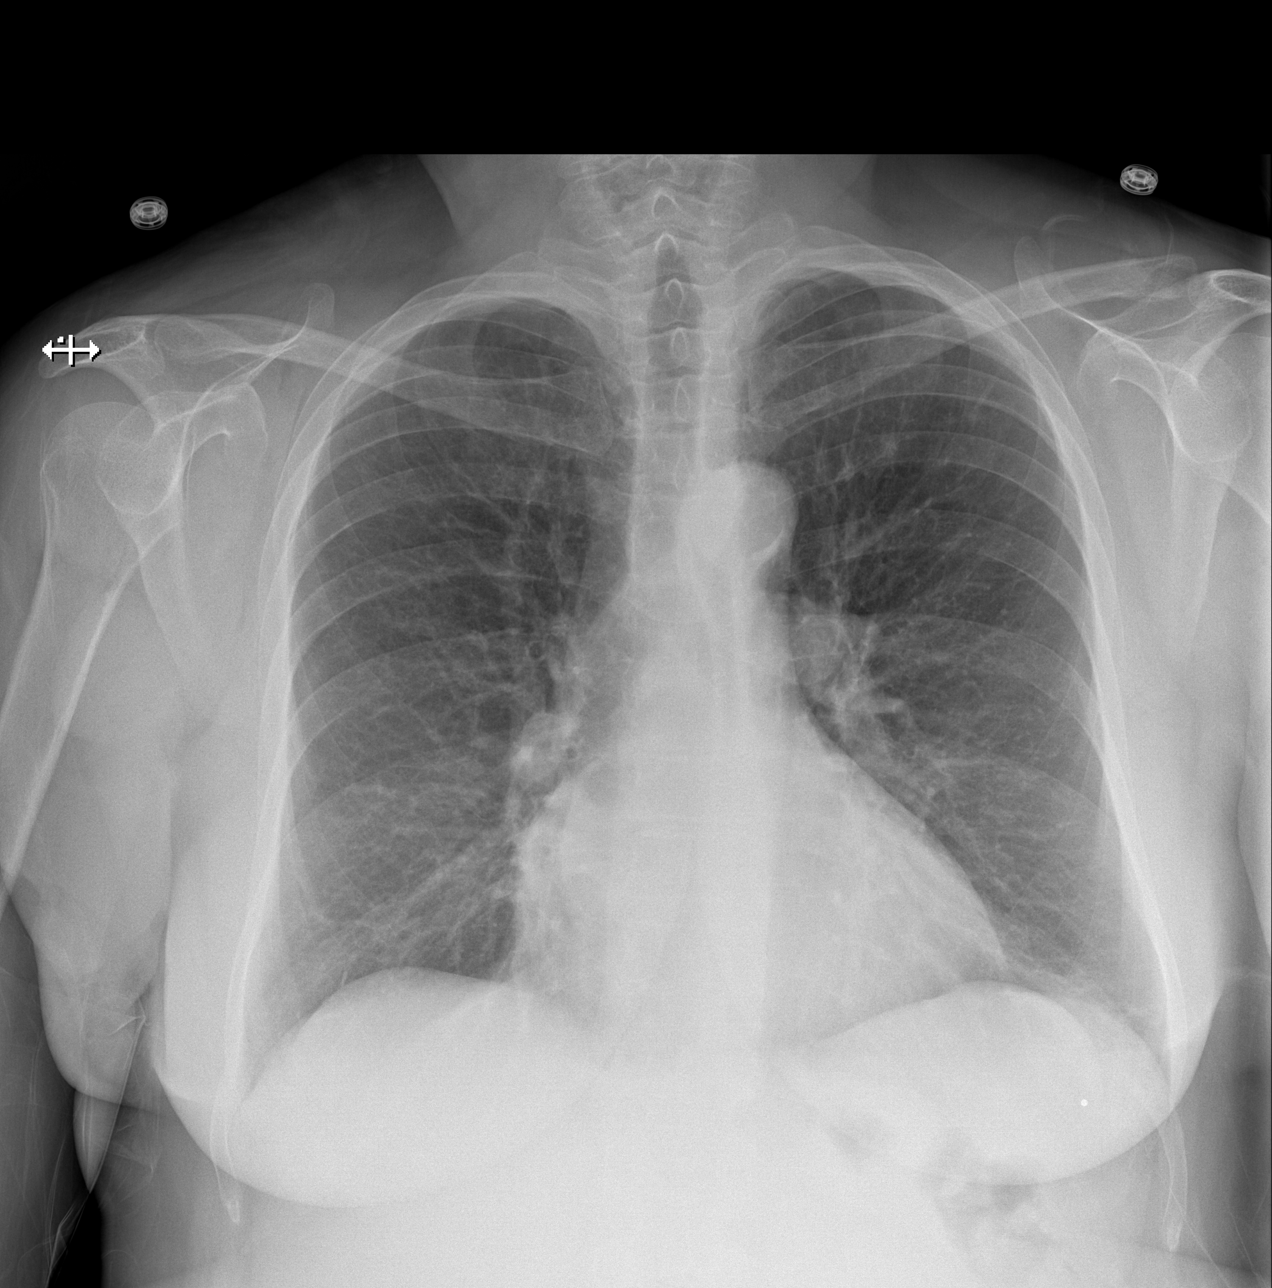

[w ribs ap lower left]
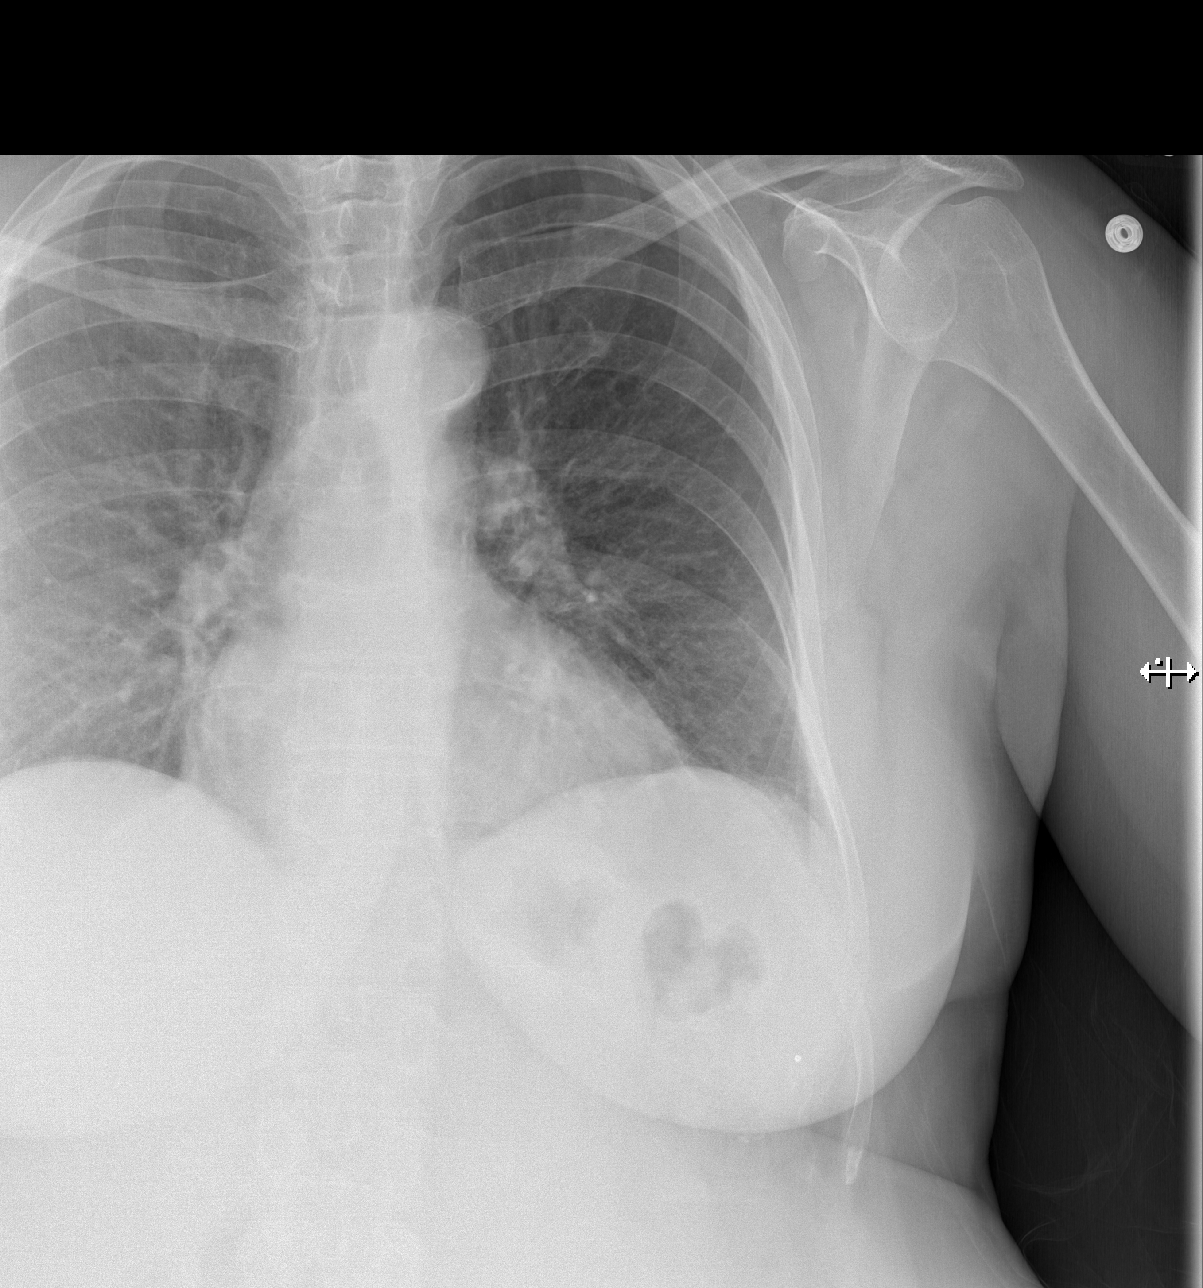

[w ribs obl left]
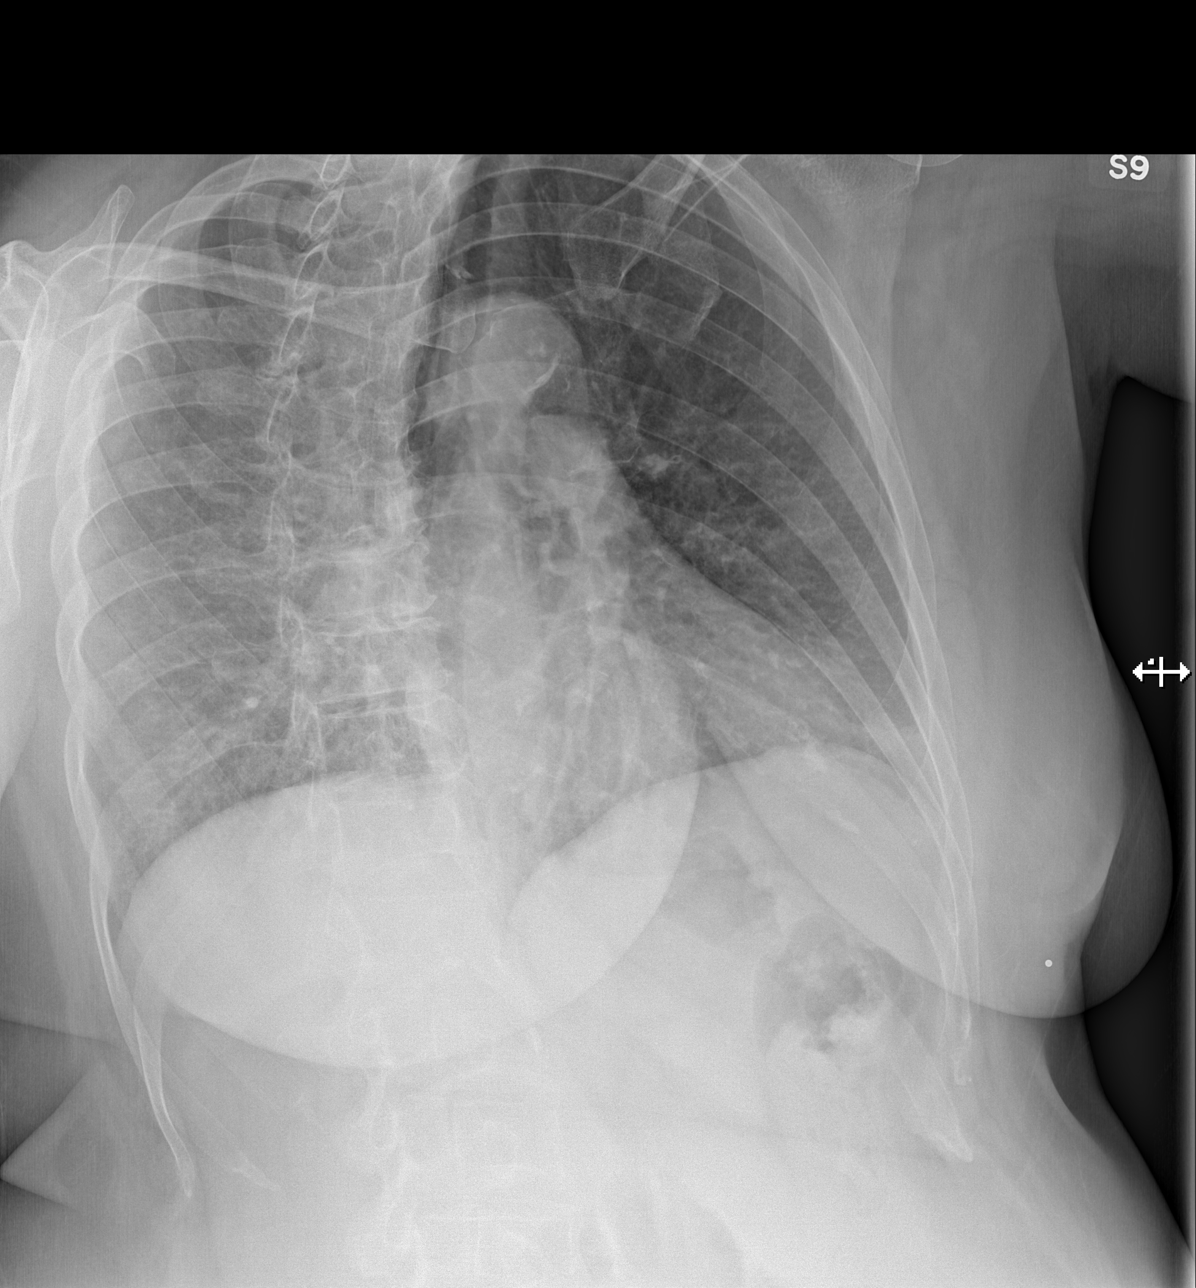

[3 of 3 positions shown; findings below may reference images not displayed]

FINDINGS: No fracture or other bone lesions are seen involving the ribs. There
is no evidence of pneumothorax or pleural effusion. Both lungs are
clear. Heart size and mediastinal contours are within normal limits.
Aortic atherosclerosis.
IMPRESSION: No acute findings.  Aortic atherosclerosis.

## 2018-05-26 IMAGING — DX DG FOREARM 2V*L*
2 series · 2 of 2 positions shown · non-contrast
Comparison: None.

CLINICAL DATA: Fall down stairs. Left forearm pain and deformity.
Initial encounter.

EXAM:
LEFT FOREARM - 2 VIEW

[x forearm ap left]
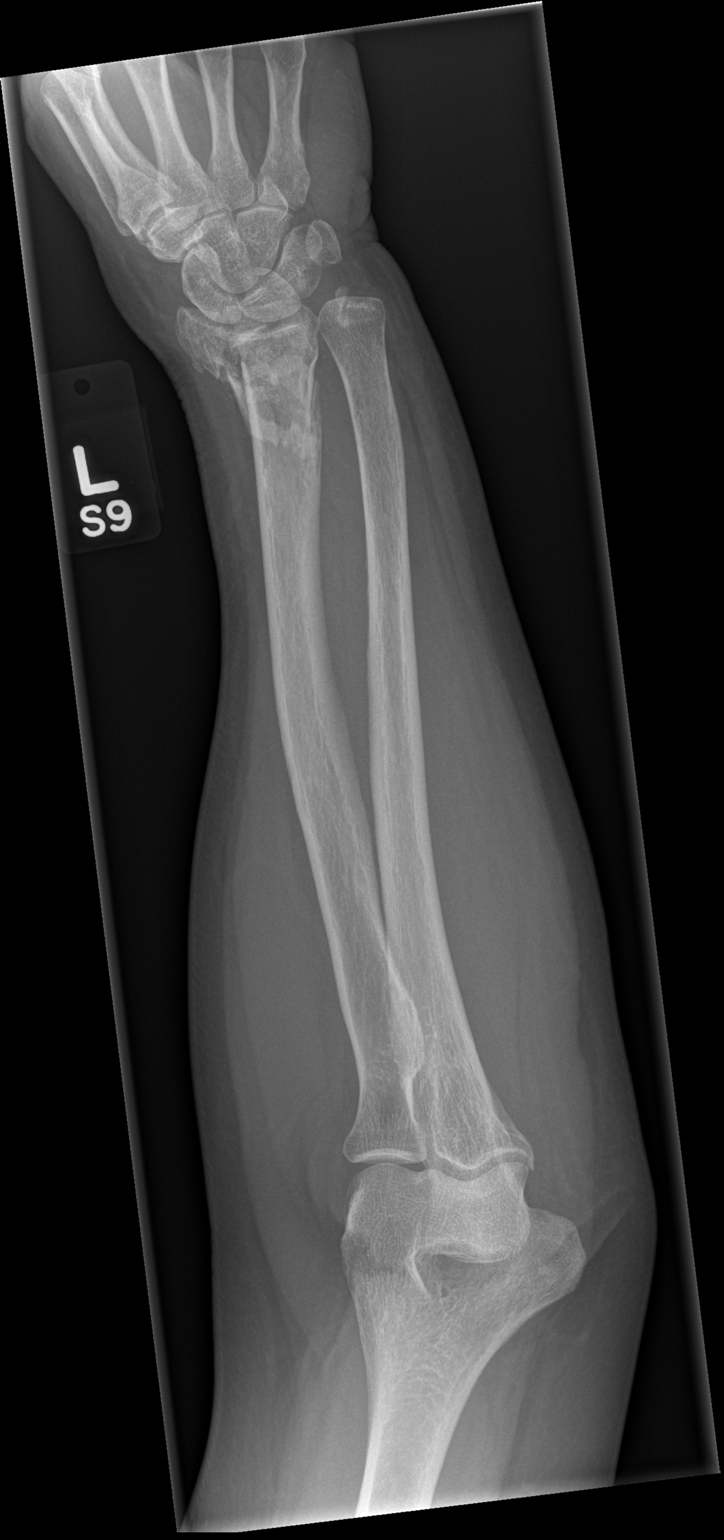

[x forearm lat left]
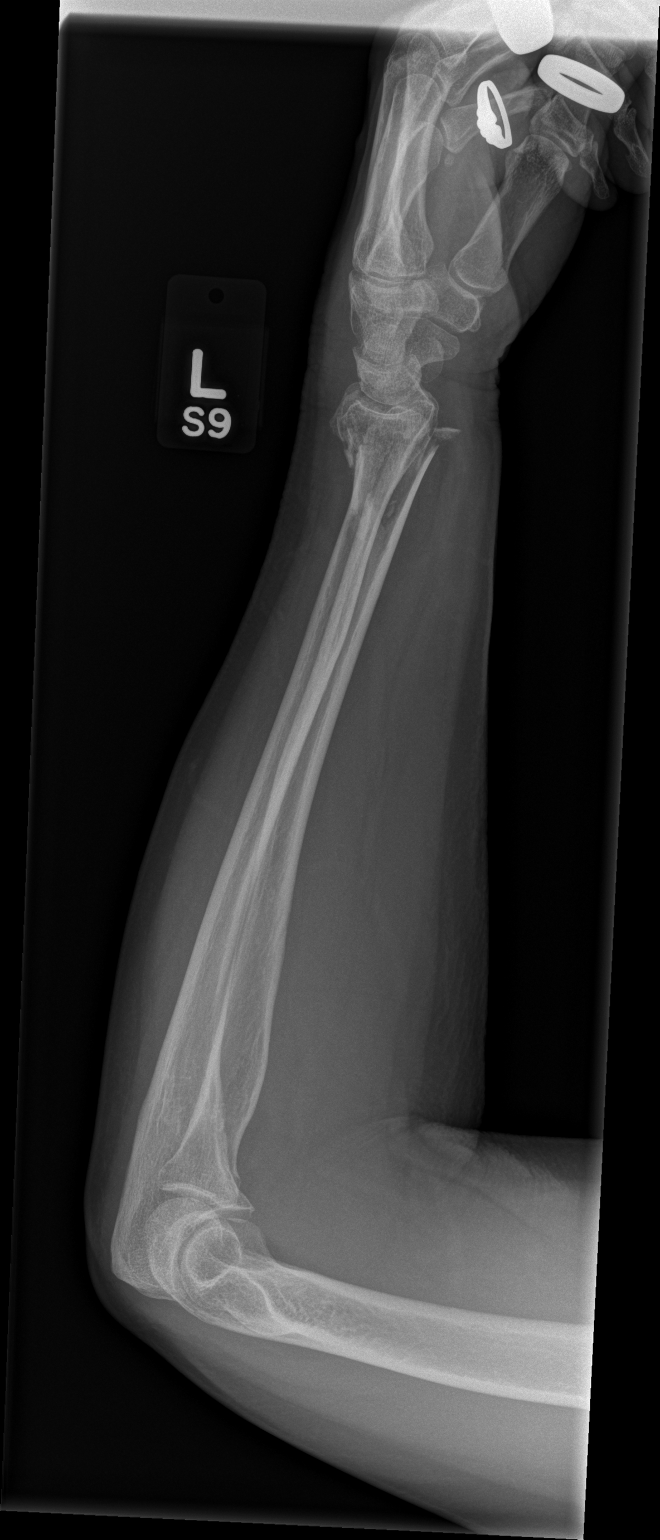

[2 of 2 positions shown; findings below may reference images not displayed]

FINDINGS: Comminuted fracture of distal radius is seen. Displaced fracture
ulnar styloid process also demonstrated.

No fractures of the proximal radius or ulna identified.
IMPRESSION: Comminuted fracture of distal radius, with distal ulnar styloid
process fracture. No proximal forearm fracture again

## 2018-08-10 DIAGNOSIS — Z1231 Encounter for screening mammogram for malignant neoplasm of breast: Secondary | ICD-10-CM | POA: Diagnosis not present

## 2018-08-10 DIAGNOSIS — L308 Other specified dermatitis: Secondary | ICD-10-CM | POA: Diagnosis not present

## 2018-08-10 DIAGNOSIS — L84 Corns and callosities: Secondary | ICD-10-CM | POA: Diagnosis not present

## 2018-10-13 DIAGNOSIS — E559 Vitamin D deficiency, unspecified: Secondary | ICD-10-CM | POA: Diagnosis not present

## 2018-10-13 DIAGNOSIS — Z0000 Encounter for general adult medical examination without abnormal findings: Secondary | ICD-10-CM | POA: Diagnosis not present

## 2018-10-13 DIAGNOSIS — N390 Urinary tract infection, site not specified: Secondary | ICD-10-CM | POA: Diagnosis not present

## 2018-10-13 DIAGNOSIS — E7800 Pure hypercholesterolemia, unspecified: Secondary | ICD-10-CM | POA: Diagnosis not present

## 2018-10-15 DIAGNOSIS — R5382 Chronic fatigue, unspecified: Secondary | ICD-10-CM | POA: Diagnosis not present

## 2018-10-15 DIAGNOSIS — Z23 Encounter for immunization: Secondary | ICD-10-CM | POA: Diagnosis not present

## 2018-10-15 DIAGNOSIS — R002 Palpitations: Secondary | ICD-10-CM | POA: Diagnosis not present

## 2018-10-15 DIAGNOSIS — E559 Vitamin D deficiency, unspecified: Secondary | ICD-10-CM | POA: Diagnosis not present

## 2018-10-15 DIAGNOSIS — Z0000 Encounter for general adult medical examination without abnormal findings: Secondary | ICD-10-CM | POA: Diagnosis not present

## 2018-10-20 ENCOUNTER — Telehealth: Payer: Self-pay | Admitting: Acute Care

## 2018-10-20 DIAGNOSIS — Z122 Encounter for screening for malignant neoplasm of respiratory organs: Secondary | ICD-10-CM

## 2018-10-20 DIAGNOSIS — Z87891 Personal history of nicotine dependence: Secondary | ICD-10-CM

## 2018-10-21 NOTE — Telephone Encounter (Signed)
Note made in error

## 2018-10-22 NOTE — Addendum Note (Signed)
Addended by: Abigail Miyamoto D on: 10/22/2018 09:11 AM   Modules accepted: Orders

## 2018-10-22 NOTE — Telephone Encounter (Signed)
Spoke with pt and scheduled SDMV 11/04/18 9:30 CT ordered Nothing further needed

## 2018-10-28 DIAGNOSIS — R002 Palpitations: Secondary | ICD-10-CM | POA: Diagnosis not present

## 2018-11-02 DIAGNOSIS — R002 Palpitations: Secondary | ICD-10-CM | POA: Diagnosis not present

## 2018-11-04 ENCOUNTER — Encounter: Payer: Self-pay | Admitting: Acute Care

## 2018-11-04 ENCOUNTER — Ambulatory Visit (INDEPENDENT_AMBULATORY_CARE_PROVIDER_SITE_OTHER)
Admission: RE | Admit: 2018-11-04 | Discharge: 2018-11-04 | Disposition: A | Discharge status: Home / Self Care | Payer: Medicare Other | Source: Ambulatory Visit | Attending: Acute Care | Admitting: Acute Care

## 2018-11-04 ENCOUNTER — Ambulatory Visit (INDEPENDENT_AMBULATORY_CARE_PROVIDER_SITE_OTHER): Payer: Medicare Other | Admitting: Acute Care

## 2018-11-04 DIAGNOSIS — Z87891 Personal history of nicotine dependence: Secondary | ICD-10-CM | POA: Diagnosis not present

## 2018-11-04 DIAGNOSIS — Z122 Encounter for screening for malignant neoplasm of respiratory organs: Secondary | ICD-10-CM

## 2018-11-04 NOTE — Progress Notes (Signed)
Shared Decision Making Visit Lung Cancer Screening Program [phone])   Eligibility:  Age 67 y.o.  Pack Years Smoking History Calculation 38 pack year smoking history (# packs/per year x # years smoked)  Recent History of coughing up blood  no  Unexplained weight loss? no ( >Than 15 pounds within the last 6 months )  Prior History Lung / other cancer no (Diagnosis within the last 5 years already requiring surveillance chest CT Scans).  Smoking Status Former Smoker  Former Smokers: Years since quit: 10 years ago  Quit Date: 01/29/2008  Visit Components:  Discussion included one or more decision making aids. yes  Discussion included risk/benefits of screening. yes  Discussion included potential follow up diagnostic testing for abnormal scans. yes  Discussion included meaning and risk of over diagnosis. yes  Discussion included meaning and risk of False Positives. yes  Discussion included meaning of total radiation exposure. yes  Counseling Included:  Importance of adherence to annual lung cancer LDCT screening. yes  Impact of comorbidities on ability to participate in the program. yes  Ability and willingness to under diagnostic treatment. yes  Smoking Cessation Counseling:  Current Smokers:   Discussed importance of smoking cessation. yes  Information about tobacco cessation classes and interventions provided to patient. yes  Patient provided with "ticket" for LDCT Scan. yes  Symptomatic Patient. no  CounselingNA  Diagnosis Code: Tobacco Use Z72.0  Asymptomatic Patient yes  Counseling (Intermediate counseling: > three minutes counseling) O1308  Former Smokers:   Discussed the importance of maintaining cigarette abstinence. yes  Diagnosis Code: Personal History of Nicotine Dependence. M57.846  Information about tobacco cessation classes and interventions provided to patient. Yes  Patient provided with "ticket" for LDCT Scan. yes  Written Order for  Lung Cancer Screening with LDCT placed in Epic. Yes (CT Chest Lung Cancer Screening Low Dose W/O CM) [id number] Z12.2-Screening of respiratory organs Z87.891-Personal history of nicotine dependence   I spent 25 minutes of face to face time with Danielle Stuart discussing the risks and benefits of lung cancer screening. We viewed a power point together that explained in detail the above noted topics. We took the time to pause the power point at intervals to allow for questions to be asked and answered to ensure understanding. We discussed that she had taken the single most powerful action possible to decrease her risk of developing lung cancer when she quit smoking. I counseled her to remain smoke free, and to contact me if she ever had the desire to smoke again so that I can provide resources and tools to help support the effort to remain smoke free. We discussed the time and location of the scan, and that either  Danielle Miyamoto RN or I will call with the results within  24-48 hours of receiving them. She has my card and contact information in the event she needs to speak with me, in addition to a copy of the power point we reviewed as a resource. She verbalized understanding of all of the above and had no further questions upon leaving the office.     I explained to the patient that there has been a high incidence of coronary artery disease noted on these exams. I explained that this is a non-gated exam therefore degree or severity cannot be determined. This patient is Not on statin therapy. I have asked the patient to follow-up with their PCP regarding any incidental finding of coronary artery disease and management with diet or medication as they feel  is clinically indicated. The patient verbalized understanding of the above and had no further questions.     Bevelyn NgoSarah F Ivori Storr, NP  11/04/2018 10:13 AM

## 2018-11-23 DIAGNOSIS — E039 Hypothyroidism, unspecified: Secondary | ICD-10-CM | POA: Diagnosis not present

## 2018-11-24 ENCOUNTER — Telehealth: Payer: Self-pay | Admitting: Acute Care

## 2018-11-24 DIAGNOSIS — Z87891 Personal history of nicotine dependence: Secondary | ICD-10-CM

## 2018-11-24 DIAGNOSIS — Z122 Encounter for screening for malignant neoplasm of respiratory organs: Secondary | ICD-10-CM

## 2018-11-25 DIAGNOSIS — E039 Hypothyroidism, unspecified: Secondary | ICD-10-CM | POA: Diagnosis not present

## 2018-11-26 NOTE — Telephone Encounter (Signed)
LMTC x 1  

## 2018-12-01 ENCOUNTER — Encounter: Payer: Self-pay | Admitting: *Deleted

## 2018-12-01 NOTE — Telephone Encounter (Signed)
LMOM for pt - Will mail letter with lung screening CT results since unable to reach pt by phone. Order placed for 1 yr f/u LDCt.  Results faxed to PCP. Nothing further needed at this time.

## 2018-12-02 ENCOUNTER — Encounter: Payer: Self-pay | Admitting: Internal Medicine

## 2018-12-02 ENCOUNTER — Ambulatory Visit: Payer: Medicare Other | Admitting: Internal Medicine

## 2018-12-02 VITALS — BP 122/70 | HR 74 | Ht 63.0 in | Wt 199.0 lb

## 2018-12-02 DIAGNOSIS — R002 Palpitations: Secondary | ICD-10-CM | POA: Diagnosis not present

## 2018-12-02 NOTE — Patient Instructions (Addendum)

## 2018-12-02 NOTE — Progress Notes (Signed)
HPI [name] is referred today for evaluation of palpitations. She is a pleasant 67 yo woman with a h/o tobacco abuse who was found to have palpitations with subsequent Zio patch demonstrating frequent PAC's. She has never had syncope. She denies chest pain. She admits to snoring at nightime. No sob. She admits to dietary indiscretion. No edema. She does not abuse caffeine or ETOH.  Allergies  Allergen Reactions  . Codeine   . Cocaine Other (See Comments)    Heart palpatations  . Latex Itching and Other (See Comments)    Skin flare up     Current Outpatient Medications  Medication Sig Dispense Refill  . cetirizine (ZYRTEC) 10 MG tablet Take 10 mg by mouth daily.    . furosemide (LASIX) 40 MG tablet Take 40 mg by mouth daily as needed.     Marland Kitchen. levothyroxine (SYNTHROID, LEVOTHROID) 50 MCG tablet Take 50 mcg by mouth daily.  4  . omeprazole (PRILOSEC) 20 MG capsule Take 20 mg by mouth daily.  0  . sertraline (ZOLOFT) 100 MG tablet Take 100 mg by mouth daily.  0   No current facility-administered medications for this visit.      No past medical history on file.  ROS:   All systems reviewed and negative except as noted in the HPI.   Past Surgical History:  Procedure Laterality Date  . BREAST SURGERY     1970's- biopsies of breast- benign   . CARDIAC CATHETERIZATION  2004   told that the results were normal  . CARPAL TUNNEL RELEASE Bilateral   . OPEN REDUCTION INTERNAL FIXATION (ORIF) DISTAL RADIAL FRACTURE Left 08/16/2016   Procedure: OPEN REDUCTION INTERNAL FIXATION (ORIF) LEFT DISTAL RADIAL FRACTURE;  Surgeon: Dairl PonderMatthew Weingold, MD;  Location: [facility] OR;  Service: Orthopedics;  Laterality: Left;     No family history on file.   Social History   Socioeconomic History  . Marital status: Single    Spouse name: Not on file  . Number of children: Not on file  . Years of education: Not on file  . Highest education level: Not on file  Occupational History  . Not on  file  Social Needs  . Financial resource strain: Not on file  . Food insecurity:    Worry: Not on file    Inability: Not on file  . Transportation needs:    Medical: Not on file    Non-medical: Not on file  Tobacco Use  . Smoking status: Former Smoker    Packs/day: 1.00    Years: 38.00    Pack years: 38.00    Types: Cigarettes    Last attempt to quit: 01/29/2008    Years since quitting: 10.8  . Smokeless tobacco: Never Used  Substance and Sexual Activity  . Alcohol use: No  . Drug use: Not on file  . Sexual activity: Not on file  Lifestyle  . Physical activity:    Days per week: Not on file    Minutes per session: Not on file  . Stress: Not on file  Relationships  . Social connections:    Talks on phone: Not on file    Gets together: Not on file    Attends religious service: Not on file    Active member of club or organization: Not on file    Attends meetings of clubs or organizations: Not on file    Relationship status: Not on file  . Intimate partner violence:  Fear of current or ex partner: Not on file    Emotionally abused: Not on file    Physically abused: Not on file    Forced sexual activity: Not on file  Other Topics Concern  . Not on file  Social History Narrative  . Not on file     BP 122/70   Pulse 74   Ht 5\' 3"  (1.6 m)   Wt 199 lb (90.3 kg)   SpO2 97%   BMI 35.25 kg/m   Physical Exam:  Well appearing overweight 67 yo woman, NAD HEENT: Unremarkable Neck:  7 cm JVD, no thyromegally Lymphatics:  No adenopathy Back:  No CVA tenderness Lungs:  Clear with no wheezes HEART:  IRegular rate rhythm, no murmurs, no rubs, no clicks Abd:  soft, positive bowel sounds, no organomegally, no rebound, no guarding Ext:  2 plus pulses, no edema, no cyanosis, no clubbing Skin:  No rashes no nodules Neuro:  CN II through XII intact, motor grossly intact   DEVICE  Normal device function.  See PaceArt for details.   Assess/Plan: 1. PAC's - I discussed the  benign nature of her PAC's. We discussed medical treatment options. At this time, she is not inclined to consider taking medications as her symptoms are mild. 2. Obesity - while being overweight is not causing her PAC's, I told her that being so could/would be more likely to lead to more serious arrhythmias.  3. Tobacco abuse - she appears to be in remission.  Leonia Reeves.D.

## 2018-12-24 DIAGNOSIS — J309 Allergic rhinitis, unspecified: Secondary | ICD-10-CM | POA: Diagnosis not present

## 2019-01-26 DIAGNOSIS — E039 Hypothyroidism, unspecified: Secondary | ICD-10-CM | POA: Diagnosis not present

## 2019-02-18 DIAGNOSIS — L258 Unspecified contact dermatitis due to other agents: Secondary | ICD-10-CM | POA: Diagnosis not present

## 2019-02-18 DIAGNOSIS — L308 Other specified dermatitis: Secondary | ICD-10-CM | POA: Diagnosis not present

## 2019-05-07 DIAGNOSIS — Z961 Presence of intraocular lens: Secondary | ICD-10-CM | POA: Diagnosis not present

## 2019-05-07 DIAGNOSIS — H524 Presbyopia: Secondary | ICD-10-CM | POA: Diagnosis not present

## 2019-05-07 DIAGNOSIS — H52203 Unspecified astigmatism, bilateral: Secondary | ICD-10-CM | POA: Diagnosis not present

## 2019-06-02 DIAGNOSIS — H5032 Intermittent alternating esotropia: Secondary | ICD-10-CM | POA: Diagnosis not present

## 2019-06-10 ENCOUNTER — Telehealth: Payer: Self-pay | Admitting: *Deleted

## 2019-06-10 NOTE — Telephone Encounter (Signed)
..   Virtual Visit Pre-Appointment Phone Call  "(Name), I am calling you today to discuss your upcoming appointment. We are currently trying to limit exposure to the virus that causes COVID-19 by seeing patients at home rather than in the office."  1. "What is the BEST phone number to call the day of the visit?" - include this in appointment notes  2. "Do you have or have access to (through a family member/friend) a smartphone with video capability that we can use for your visit?" a. If yes - list this number in appt notes as "cell" (if different from BEST phone #) and list the appointment type as a VIDEO visit in appointment notes b. If no - list the appointment type as a PHONE visit in appointment notes  3. Confirm consent - "In the setting of the current Covid19 crisis, you are scheduled for a (phone or video) visit with your provider on (date) at (time).  Just as we do with many in-office visits, in order for you to participate in this visit, we must obtain consent.  If you'd like, I can send this to your mychart (if signed up) or email for you to review.  Otherwise, I can obtain your verbal consent now.  All virtual visits are billed to your insurance company just like a normal visit would be.  By agreeing to a virtual visit, we'd like you to understand that the technology does not allow for your provider to perform an examination, and thus may limit your provider's ability to fully assess your condition. If your provider identifies any concerns that need to be evaluated in person, we will make arrangements to do so.  Finally, though the technology is pretty good, we cannot assure that it will always work on either your or our end, and in the setting of a video visit, we may have to convert it to a phone-only visit.  In either situation, we cannot ensure that we have a secure connection.  Are you willing to proceed?" STAFF: Did the patient verbally acknowledge consent to telehealth visit? Document  YES/NO here: YES  4. Advise patient to be prepared - "Two hours prior to your appointment, go ahead and check your blood pressure, pulse, oxygen saturation, and your weight (if you have the equipment to check those) and write them all down. When your visit starts, your provider will ask you for this information. If you have an Apple Watch or Kardia device, please plan to have heart rate information ready on the day of your appointment. Please have a pen and paper handy nearby the day of the visit as well."  5. Give patient instructions for MyChart download to smartphone OR [email or web address] as below if video visit (depending on what platform provider is [place]  6. Inform patient they will receive a phone call 15 minutes prior to their appointment time (may be from unknown caller ID) so they should be prepared to answer    [place] has been deemed a candidate for a follow-up tele-health visit to limit community exposure during the Covid-19 pandemic. I spoke with the patient via phone to ensure availability of phone/video source, confirm preferred email & phone number, and discuss instructions and expectations.  I reminded Danielle Stuart to be prepared with any vital sign and/or heart rhythm information that could potentially be obtained via home monitoring, at the time of her visit. I reminded Danielle Stuart to expect a phone call prior to her visit.  [name], [name] L, CMA 06/10/2019 5:02 PM   INSTRUCTIONS FOR DOWNLOADING THE MYCHART APP TO SMARTPHONE  - The patient must first make sure to have activated MyChart and know their login information - If Apple, go to [employer] Store and type in MyChart in the search bar and download the app. If Android, ask patient to go to [employer] Play Store and type in [facility] in the search bar and download the app. The app is free but as with any other app downloads, their phone may require  them to verify saved payment information or Apple/Android password.  - The patient will need to then log into the app with their MyChart username and password, and select [facility] as their healthcare provider to link the account. When it is time for your visit, go to the MyChart app, find appointments, and click Begin Video Visit. Be sure to Select Allow for your device to access the Microphone and Camera for your visit. You will then be connected, and your provider will be with you shortly.  **If they have any issues connecting, or need assistance please contact MyChart service desk [phone])**  **If using a computer, in order to ensure the best quality for their visit they will need to use either of the following Internet Browsers: [employer] Edge, or Google Chrome**  IF USING DOXIMITY or DOXY.ME - The patient will receive a link just prior to their visit by text.     FULL LENGTH CONSENT FOR TELE-HEALTH VISIT   I hereby voluntarily request, consent and authorize [facility] and its employed or contracted physicians, physician assistants, nurse practitioners or other licensed health care professionals (the Practitioner), to provide me with telemedicine health care services (the "Services") as deemed necessary by the treating Practitioner. I acknowledge and consent to receive the Services by the Practitioner via telemedicine. I understand that the telemedicine visit will involve communicating with the Practitioner through live audiovisual communication technology and the disclosure of certain medical information by electronic transmission. I acknowledge that I have been given the opportunity to request an in-person assessment or other available alternative prior to the telemedicine visit and am voluntarily participating in the telemedicine visit.  I understand that I have the right to withhold or withdraw my consent to the use of telemedicine in the course of my care at any  time, without affecting my right to future care or treatment, and that the Practitioner or I may terminate the telemedicine visit at any time. I understand that I have the right to inspect all information obtained and/or recorded in the course of the telemedicine visit and may receive copies of available information for a reasonable fee.  I understand that some of the potential risks of receiving the Services via telemedicine include:  Marland Kitchen. Delay or interruption in medical evaluation due to technological equipment failure or disruption; . Information transmitted may not be sufficient (e.g. poor resolution of images) to allow for appropriate medical decision making by the Practitioner; and/or  . In rare instances, security protocols could fail, causing a breach of personal health information.  Furthermore, I acknowledge that it is my responsibility to provide information about my medical history, conditions and care that is complete and accurate to the best of my ability. I acknowledge that Practitioner's advice, recommendations, and/or decision may be based on factors not within their control, such as incomplete or inaccurate data provided by me or distortions of diagnostic  images or specimens that may result from electronic transmissions. I understand that the practice of medicine is not an exact science and that Practitioner makes no warranties or guarantees regarding treatment outcomes. I acknowledge that I will receive a copy of this consent concurrently upon execution via email to the email address I last provided but may also request a printed copy by calling the office of [facility].    I understand that my insurance will be billed for this visit.   I have read or had this consent read to me. . I understand the contents of this consent, which adequately explains the benefits and risks of the Services being provided via telemedicine.  . I have been provided ample opportunity to ask questions  regarding this consent and the Services and have had my questions answered to my satisfaction. . I give my informed consent for the services to be provided through the use of telemedicine in my medical care  By participating in this telemedicine visit I agree to the above.

## 2019-06-10 NOTE — Telephone Encounter (Signed)
New Message ° ° °Patient returning your phone call. °

## 2019-06-10 NOTE — Telephone Encounter (Signed)
LMTCB to discuss video vs telephone virtual visit for 6/25 appointment.

## 2019-06-17 ENCOUNTER — Telehealth (INDEPENDENT_AMBULATORY_CARE_PROVIDER_SITE_OTHER): Payer: Medicare Other | Admitting: Internal Medicine

## 2019-06-17 ENCOUNTER — Other Ambulatory Visit: Payer: Self-pay

## 2019-06-17 DIAGNOSIS — R002 Palpitations: Secondary | ICD-10-CM

## 2019-06-17 NOTE — Progress Notes (Signed)
Electrophysiology TeleHealth Note   Due to national recommendations of social distancing due to COVID 19, an audio/video telehealth visit is felt to be most appropriate for this patient at this time.  See MyChart message from today for the patient's consent to telehealth for [facility].   Date:  06/17/2019   ID:  Danielle Stuart, DOB Apr 03, 1951, MRN [id number]  Location: patient's home  Provider location: [address], [place]  Evaluation Performed: Follow-up visit  PCP:  Deland Pretty, MD  Cardiologist:  No primary care provider on file. Electrophysiologist:  Dr Lovena Le  Chief Complaint:  "We are doing well."  History of Present Illness:    Danielle Stuart is a 68 y.o. female who presents via audio/video conferencing for a telehealth visit today. She has a h/o palpitations. She was found to have PAC's. She has done well in the past 6 months.Today, she denies symptoms of palpitations, chest pain, shortness of breath,  lower extremity edema, dizziness, presyncope, or syncope.  The patient is otherwise without complaint today.  The patient denies symptoms of fevers, chills, cough, or new SOB worrisome for COVID 19.  No past medical history on file.  Past Surgical History:  Procedure Laterality Date  . BREAST SURGERY     1970's- biopsies of breast- benign   . CARDIAC CATHETERIZATION  2004   told that the results were normal  . CARPAL TUNNEL RELEASE Bilateral   . OPEN REDUCTION INTERNAL FIXATION (ORIF) DISTAL RADIAL FRACTURE Left 08/16/2016   Procedure: OPEN REDUCTION INTERNAL FIXATION (ORIF) LEFT DISTAL RADIAL FRACTURE;  Surgeon: Charlotte Crumb, MD;  Location: [place];  Service: Orthopedics;  Laterality: Left;    Current Outpatient Medications  Medication Sig Dispense Refill  . cetirizine (ZYRTEC) 10 MG tablet Take 10 mg by mouth daily.    . furosemide (LASIX) 40 MG tablet Take 40 mg by mouth daily as needed.     Marland Kitchen levothyroxine (SYNTHROID, LEVOTHROID) 50 MCG  tablet Take 50 mcg by mouth daily.  4  . omeprazole (PRILOSEC) 20 MG capsule Take 20 mg by mouth daily.  0  . sertraline (ZOLOFT) 100 MG tablet Take 100 mg by mouth daily.  0   No current facility-administered medications for this visit.     Allergies:   Codeine, Cocaine, and Latex   Social History:  The patient  reports that she quit smoking about 11 years ago. Her smoking use included cigarettes. She has a 38.00 pack-year smoking history. She has never used smokeless tobacco. She reports that she does not drink alcohol.   Family History:  The patient's  family history is not on file.   ROS:  Please see the history of present illness.   All other systems are personally reviewed and negative.    Exam:    Vital Signs:  BP - 131/71, P - 65  Well appearing, alert and conversant, regular work of breathing,  good skin color Eyes- anicteric, neuro- grossly intact, skin- no apparent rash or lesions or cyanosis, mouth- oral mucosa is pink   Labs/Other Tests and Data Reviewed:    Recent Labs: No results found for requested labs within last 8760 hours.   Wt Readings from Last 3 Encounters:  12/02/18 199 lb (90.3 kg)  08/16/16 180 lb (81.6 kg)     Other studies personally reviewed: Additional studies/ records that were reviewed today include: none    ASSESSMENT & PLAN:    1.  PAC's - she is stable.  She has tried to avoid caffeine. She denies ETOH abuse.  2. Obesity - her weight has not worsened since her last visit. 3. COVID 19 screen The patient denies symptoms of COVID 19 at this time.  The importance of social distancing was discussed today.  Follow-up:  12 months Next remote: n/a  Current medicines are reviewed at length with the patient today.   The patient does not have concerns regarding her medicines.  The following changes were made today:  none  Labs/ tests ordered today include: none No orders of the defined types were placed in this encounter.    Patient Risk:   after full review of this patients clinical status, I feel that they are at moderate risk at this time.  Today, I have spent 15 minutes with the patient with telehealth technology discussing all of the above.    Signed, Lewayne BuntingGregg Vashaun Osmon, MD  06/17/2019 9:57 AM     [facility] [address] [place] [address] [phone] (office) [phone] (fax)

## 2019-08-11 DIAGNOSIS — L308 Other specified dermatitis: Secondary | ICD-10-CM | POA: Diagnosis not present

## 2019-08-11 DIAGNOSIS — L258 Unspecified contact dermatitis due to other agents: Secondary | ICD-10-CM | POA: Diagnosis not present

## 2019-08-16 DIAGNOSIS — L258 Unspecified contact dermatitis due to other agents: Secondary | ICD-10-CM | POA: Diagnosis not present

## 2019-09-01 DIAGNOSIS — Z23 Encounter for immunization: Secondary | ICD-10-CM | POA: Diagnosis not present

## 2019-10-19 DIAGNOSIS — E039 Hypothyroidism, unspecified: Secondary | ICD-10-CM | POA: Diagnosis not present

## 2019-10-19 DIAGNOSIS — E7800 Pure hypercholesterolemia, unspecified: Secondary | ICD-10-CM | POA: Diagnosis not present

## 2019-10-19 DIAGNOSIS — N390 Urinary tract infection, site not specified: Secondary | ICD-10-CM | POA: Diagnosis not present

## 2019-10-22 DIAGNOSIS — G2581 Restless legs syndrome: Secondary | ICD-10-CM | POA: Diagnosis not present

## 2019-10-22 DIAGNOSIS — Z0000 Encounter for general adult medical examination without abnormal findings: Secondary | ICD-10-CM | POA: Diagnosis not present

## 2019-10-22 DIAGNOSIS — E7800 Pure hypercholesterolemia, unspecified: Secondary | ICD-10-CM | POA: Diagnosis not present

## 2019-10-22 DIAGNOSIS — R600 Localized edema: Secondary | ICD-10-CM | POA: Diagnosis not present

## 2019-11-25 ENCOUNTER — Other Ambulatory Visit: Payer: Self-pay

## 2019-11-25 ENCOUNTER — Ambulatory Visit (INDEPENDENT_AMBULATORY_CARE_PROVIDER_SITE_OTHER)
Admission: RE | Admit: 2019-11-25 | Discharge: 2019-11-25 | Disposition: A | Discharge status: Home / Self Care | Payer: Medicare Other | Source: Ambulatory Visit | Attending: Acute Care | Admitting: Acute Care

## 2019-11-25 DIAGNOSIS — Z87891 Personal history of nicotine dependence: Secondary | ICD-10-CM

## 2019-11-25 DIAGNOSIS — Z122 Encounter for screening for malignant neoplasm of respiratory organs: Secondary | ICD-10-CM

## 2019-12-01 ENCOUNTER — Other Ambulatory Visit: Payer: Self-pay | Admitting: *Deleted

## 2019-12-01 DIAGNOSIS — Z122 Encounter for screening for malignant neoplasm of respiratory organs: Secondary | ICD-10-CM

## 2019-12-01 DIAGNOSIS — Z87891 Personal history of nicotine dependence: Secondary | ICD-10-CM

## 2019-12-03 ENCOUNTER — Ambulatory Visit (HOSPITAL_COMMUNITY)
Admission: RE | Admit: 2019-12-03 | Discharge: 2019-12-03 | Disposition: A | Discharge status: Home / Self Care | Payer: Medicare Other | Source: Ambulatory Visit | Attending: Internal Medicine | Admitting: Internal Medicine

## 2019-12-03 ENCOUNTER — Other Ambulatory Visit: Payer: Self-pay

## 2019-12-03 ENCOUNTER — Other Ambulatory Visit (HOSPITAL_COMMUNITY): Payer: Self-pay | Admitting: Internal Medicine

## 2019-12-03 DIAGNOSIS — L209 Atopic dermatitis, unspecified: Secondary | ICD-10-CM | POA: Diagnosis not present

## 2019-12-03 DIAGNOSIS — M7989 Other specified soft tissue disorders: Secondary | ICD-10-CM

## 2019-12-03 DIAGNOSIS — M79662 Pain in left lower leg: Secondary | ICD-10-CM | POA: Diagnosis not present

## 2019-12-03 DIAGNOSIS — M79605 Pain in left leg: Secondary | ICD-10-CM | POA: Diagnosis not present

## 2019-12-03 NOTE — Progress Notes (Addendum)
Lower extremity venous has been completed.   Preliminary results in CV Proc.   Attempted to call, office closed.   Abram Sander 12/03/2019 2:00 PM

## 2019-12-30 DIAGNOSIS — R609 Edema, unspecified: Secondary | ICD-10-CM | POA: Diagnosis not present

## 2020-01-05 DIAGNOSIS — Z79899 Other long term (current) drug therapy: Secondary | ICD-10-CM | POA: Diagnosis not present

## 2020-01-05 DIAGNOSIS — R609 Edema, unspecified: Secondary | ICD-10-CM | POA: Diagnosis not present

## 2020-01-12 DIAGNOSIS — I8312 Varicose veins of left lower extremity with inflammation: Secondary | ICD-10-CM | POA: Diagnosis not present

## 2020-01-12 DIAGNOSIS — I83812 Varicose veins of left lower extremities with pain: Secondary | ICD-10-CM | POA: Diagnosis not present

## 2020-01-12 DIAGNOSIS — I8311 Varicose veins of right lower extremity with inflammation: Secondary | ICD-10-CM | POA: Diagnosis not present

## 2020-01-19 DIAGNOSIS — I8312 Varicose veins of left lower extremity with inflammation: Secondary | ICD-10-CM | POA: Diagnosis not present

## 2020-01-19 DIAGNOSIS — I8311 Varicose veins of right lower extremity with inflammation: Secondary | ICD-10-CM | POA: Diagnosis not present

## 2020-01-24 DIAGNOSIS — I8312 Varicose veins of left lower extremity with inflammation: Secondary | ICD-10-CM | POA: Diagnosis not present

## 2020-01-24 DIAGNOSIS — I8311 Varicose veins of right lower extremity with inflammation: Secondary | ICD-10-CM | POA: Diagnosis not present

## 2020-01-24 DIAGNOSIS — I83893 Varicose veins of bilateral lower extremities with other complications: Secondary | ICD-10-CM | POA: Diagnosis not present

## 2020-02-22 DIAGNOSIS — R040 Epistaxis: Secondary | ICD-10-CM | POA: Diagnosis not present

## 2020-03-21 DIAGNOSIS — I83893 Varicose veins of bilateral lower extremities with other complications: Secondary | ICD-10-CM | POA: Diagnosis not present

## 2020-03-21 DIAGNOSIS — R040 Epistaxis: Secondary | ICD-10-CM | POA: Diagnosis not present

## 2020-03-21 DIAGNOSIS — I83813 Varicose veins of bilateral lower extremities with pain: Secondary | ICD-10-CM | POA: Diagnosis not present

## 2020-03-21 DIAGNOSIS — J343 Hypertrophy of nasal turbinates: Secondary | ICD-10-CM | POA: Diagnosis not present

## 2020-03-21 DIAGNOSIS — I8311 Varicose veins of right lower extremity with inflammation: Secondary | ICD-10-CM | POA: Diagnosis not present

## 2020-03-21 DIAGNOSIS — I8312 Varicose veins of left lower extremity with inflammation: Secondary | ICD-10-CM | POA: Diagnosis not present

## 2020-03-21 DIAGNOSIS — J342 Deviated nasal septum: Secondary | ICD-10-CM | POA: Diagnosis not present

## 2020-03-28 DIAGNOSIS — L710 Perioral dermatitis: Secondary | ICD-10-CM | POA: Diagnosis not present

## 2020-03-28 DIAGNOSIS — H26492 Other secondary cataract, left eye: Secondary | ICD-10-CM | POA: Diagnosis not present

## 2020-04-24 DIAGNOSIS — L258 Unspecified contact dermatitis due to other agents: Secondary | ICD-10-CM | POA: Diagnosis not present

## 2020-08-13 IMAGING — CT CT CHEST LUNG CANCER SCREENING LOW DOSE W/O CM
2 of 4 series · 15 of 40 positions shown, 18 images · non-contrast
Comparison: None.

CLINICAL DATA: 67-year-old female with 38 pack-year history of
smoking. Lung cancer screening.

EXAM:
CT CHEST WITHOUT CONTRAST LOW-DOSE FOR LUNG CANCER SCREENING
TECHNIQUE: Multidetector CT imaging of the chest was performed following the
standard protocol without IV contrast.

[Series 2: thorax 5.0 i31f 3 · axial · 0.70mm/px · z∈[-301,-41]mm · 12 of 62 slices shown, 15 images]
[im 5/62  mediastinal]
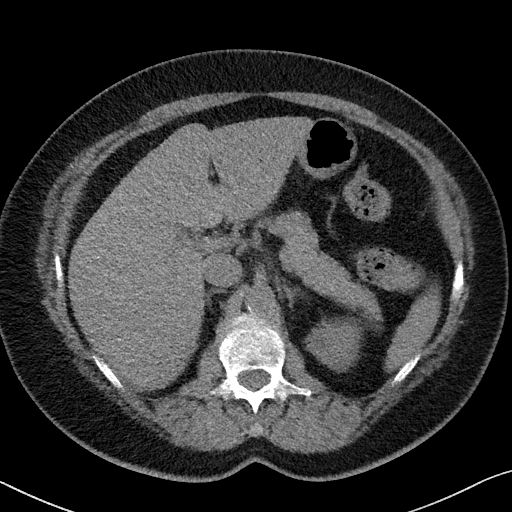
[im 5/62  lung]
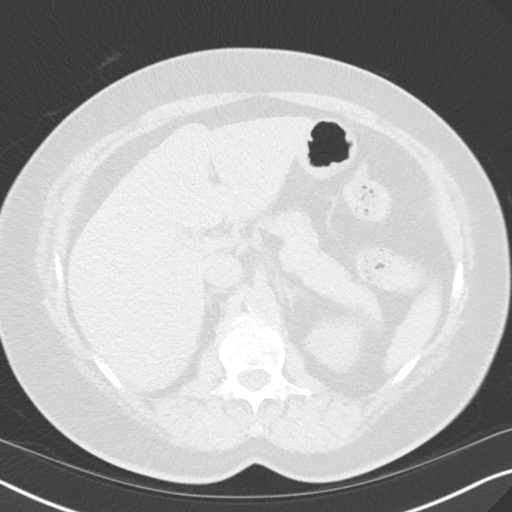
[im 10/62  lung]
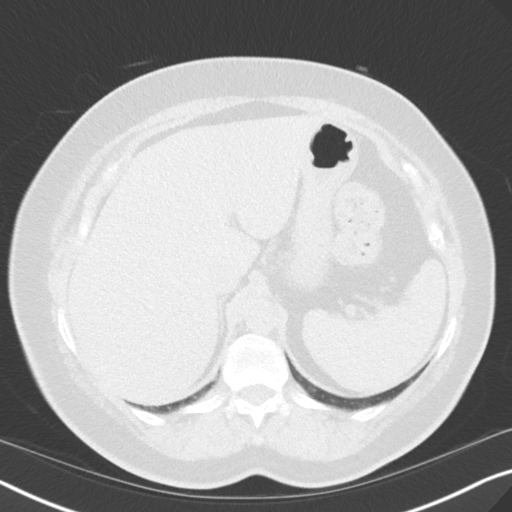
[im 15/62  lung]
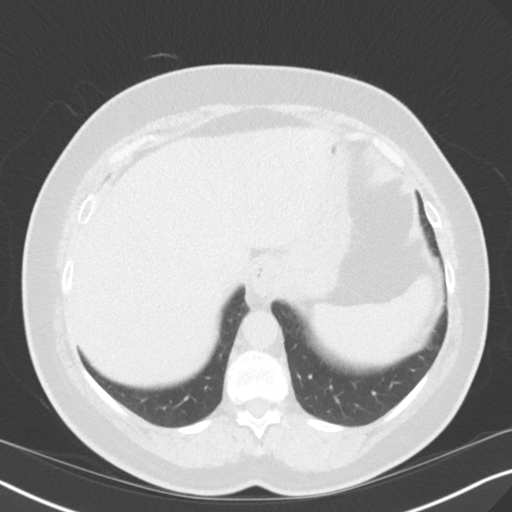
[im 19/62  lung]
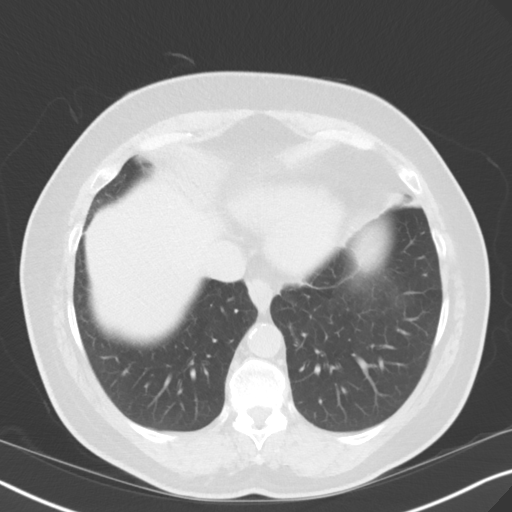
[im 24/62  mediastinal]
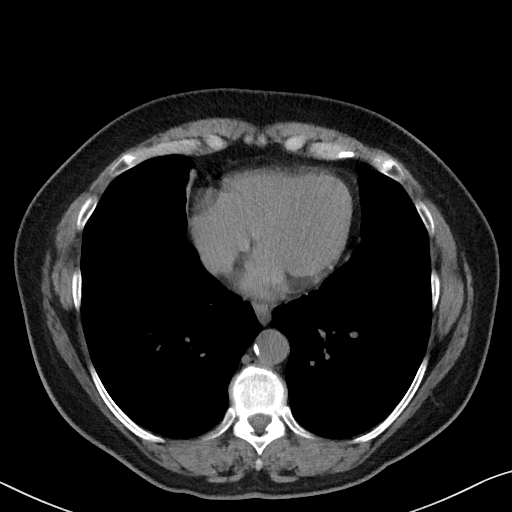
[im 24/62  lung]
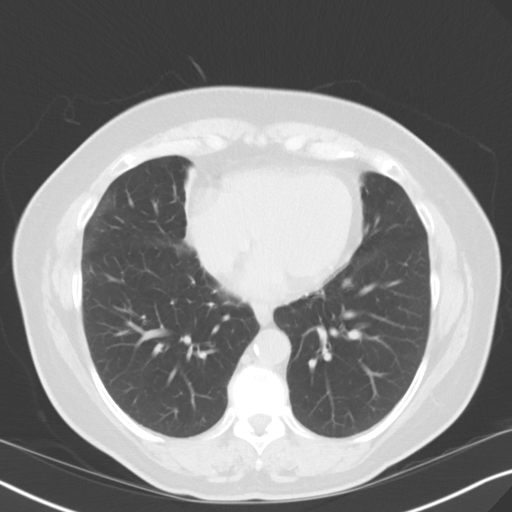
[im 29/62  lung]
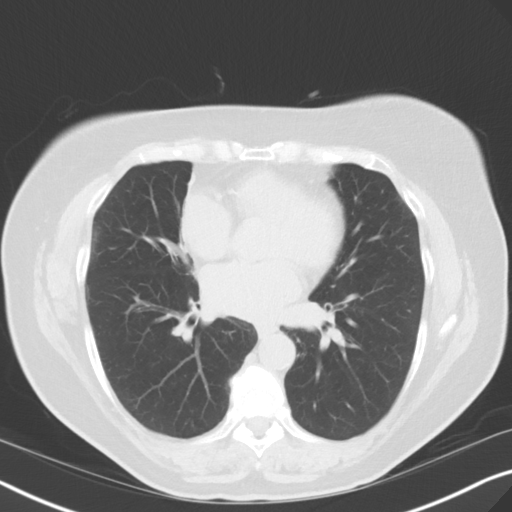
[im 33/62  lung]
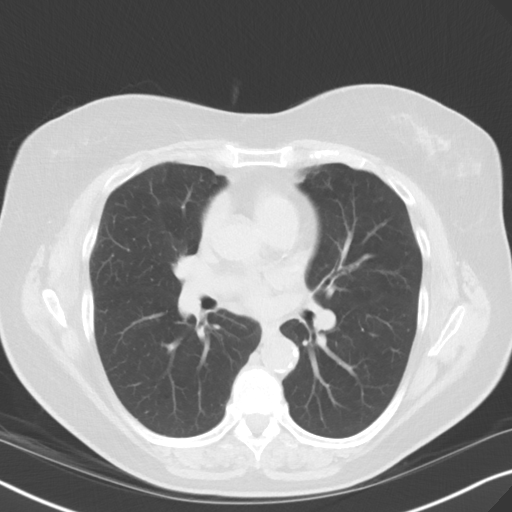
[im 38/62  lung]
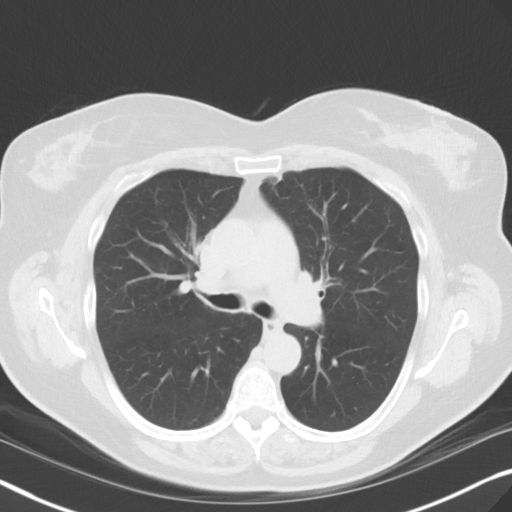
[im 43/62  mediastinal]
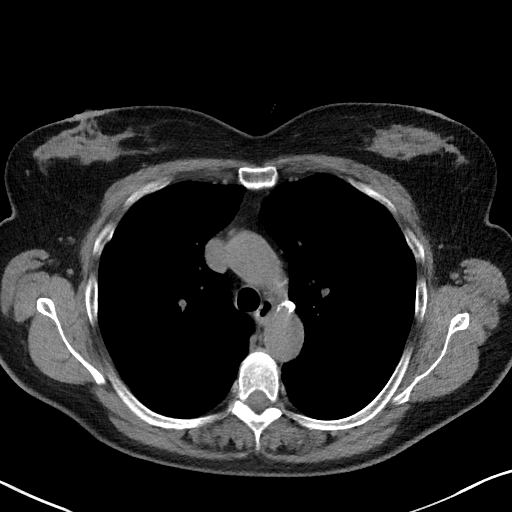
[im 43/62  lung]
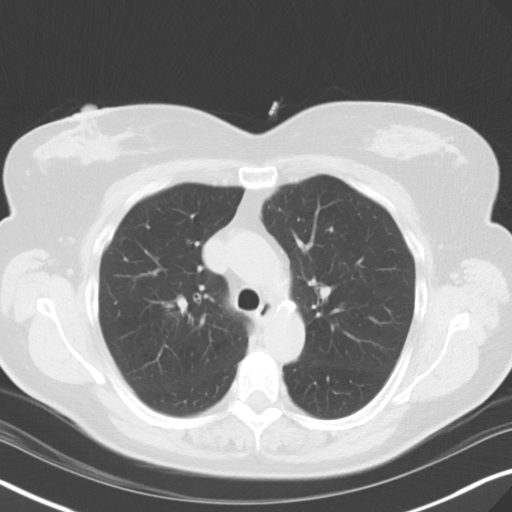
[im 47/62  lung]
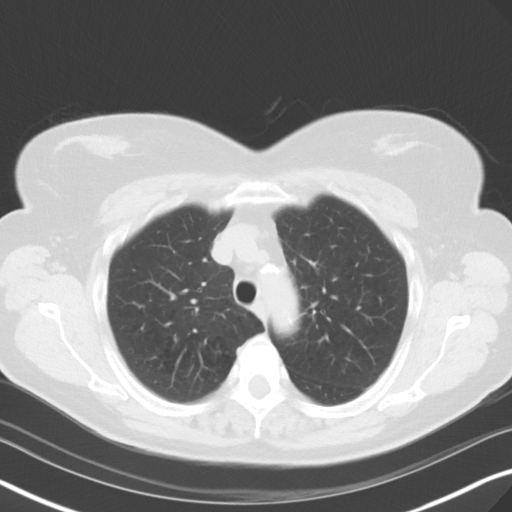
[im 52/62  lung]
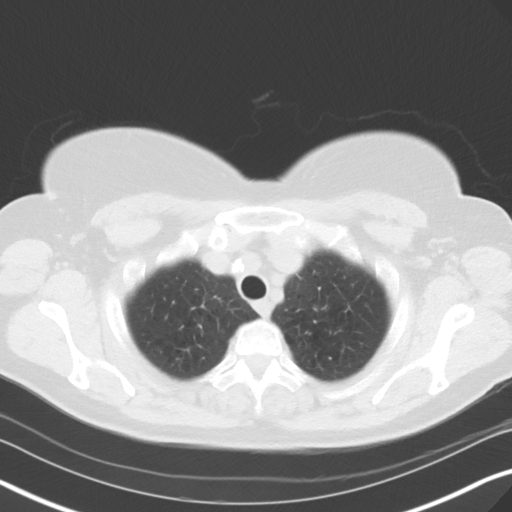
[im 57/62  lung]
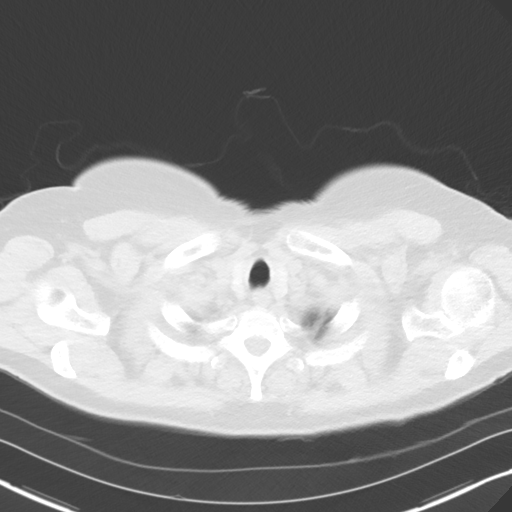

[Series 5: coronal · coronal · 0.60mm/px · 3 of 129 slices shown]
[im 26/129  lung]
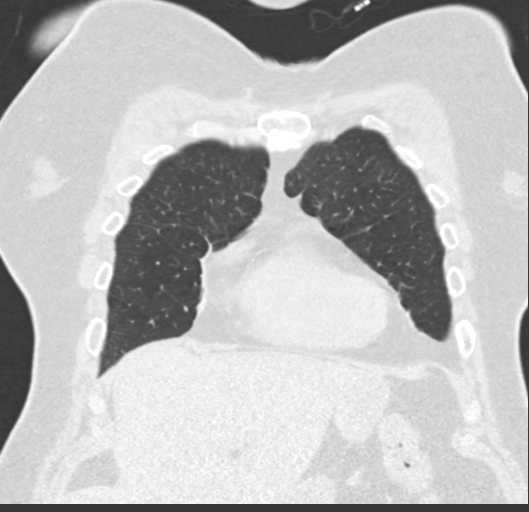
[im 52/129  lung]
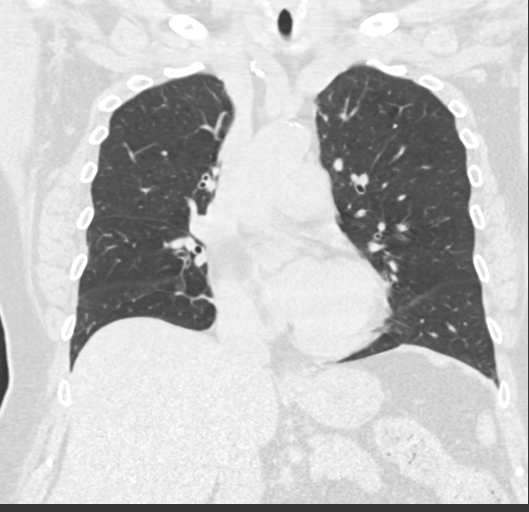
[im 77/129  lung]
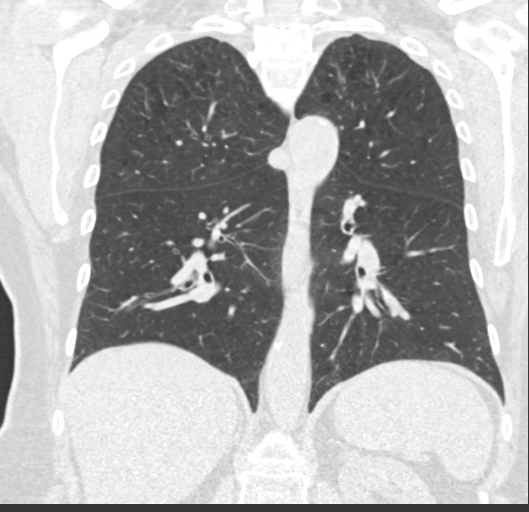

[15 of 40 positions shown; findings below may reference images not displayed]

FINDINGS: Cardiovascular: The heart size is normal. No substantial pericardial
effusion. Coronary artery calcification is evident. Atherosclerotic
calcification is noted in the wall of the thoracic aorta.

Mediastinum/Nodes: No mediastinal lymphadenopathy. No evidence for
gross hilar lymphadenopathy although assessment is limited by the
lack of intravenous contrast on today's study. There is no axillary
lymphadenopathy. The esophagus has normal imaging features.

Lungs/Pleura: The central tracheobronchial airways are patent.
Centrilobular emphysema noted. Platelike atelectasis or scarring
noted right middle lobe. Posterior left upper lobe pulmonary nodule
along the major fissure demonstrates volume derived equivalent
diameter 5.1 mm no suspicious pulmonary nodule or mass. No focal
consolidation. No edema or pleural effusion.

Upper Abdomen: The liver shows diffusely decreased attenuation
suggesting steatosis. Otherwise unremarkable.

Musculoskeletal: No worrisome lytic or sclerotic osseous
abnormality.
IMPRESSION: 1. Lung-RADS 2, benign appearance or behavior. Continue annual
screening with low-dose chest CT without contrast in 12 months.
2.  Emphysema. (0GA84-SRL.N)
3.  Aortic Atherosclerois (0GA84-170.0) the

## 2020-08-16 DIAGNOSIS — H5032 Intermittent alternating esotropia: Secondary | ICD-10-CM | POA: Diagnosis not present

## 2020-09-06 DIAGNOSIS — H5005 Alternating esotropia: Secondary | ICD-10-CM | POA: Diagnosis not present

## 2020-09-06 DIAGNOSIS — H5032 Intermittent alternating esotropia: Secondary | ICD-10-CM | POA: Diagnosis not present

## 2020-10-26 DIAGNOSIS — E7800 Pure hypercholesterolemia, unspecified: Secondary | ICD-10-CM | POA: Diagnosis not present

## 2020-10-26 DIAGNOSIS — E039 Hypothyroidism, unspecified: Secondary | ICD-10-CM | POA: Diagnosis not present

## 2020-10-30 DIAGNOSIS — Z0000 Encounter for general adult medical examination without abnormal findings: Secondary | ICD-10-CM | POA: Diagnosis not present

## 2020-10-30 DIAGNOSIS — I2510 Atherosclerotic heart disease of native coronary artery without angina pectoris: Secondary | ICD-10-CM | POA: Diagnosis not present

## 2020-10-30 DIAGNOSIS — Z23 Encounter for immunization: Secondary | ICD-10-CM | POA: Diagnosis not present

## 2020-10-30 DIAGNOSIS — G2581 Restless legs syndrome: Secondary | ICD-10-CM | POA: Diagnosis not present

## 2020-11-02 DIAGNOSIS — Z961 Presence of intraocular lens: Secondary | ICD-10-CM | POA: Diagnosis not present

## 2020-11-02 DIAGNOSIS — H5000 Unspecified esotropia: Secondary | ICD-10-CM | POA: Diagnosis not present

## 2020-11-02 DIAGNOSIS — H52203 Unspecified astigmatism, bilateral: Secondary | ICD-10-CM | POA: Diagnosis not present

## 2020-11-14 ENCOUNTER — Other Ambulatory Visit: Payer: Self-pay

## 2020-11-14 ENCOUNTER — Ambulatory Visit: Payer: Medicare Other | Admitting: Interventional Cardiology

## 2020-11-14 VITALS — BP 140/74 | HR 72 | Ht 63.0 in | Wt 200.2 lb

## 2020-11-14 DIAGNOSIS — I2510 Atherosclerotic heart disease of native coronary artery without angina pectoris: Secondary | ICD-10-CM

## 2020-11-14 DIAGNOSIS — R739 Hyperglycemia, unspecified: Secondary | ICD-10-CM

## 2020-11-14 DIAGNOSIS — I491 Atrial premature depolarization: Secondary | ICD-10-CM

## 2020-11-14 DIAGNOSIS — E782 Mixed hyperlipidemia: Secondary | ICD-10-CM | POA: Diagnosis not present

## 2020-11-14 NOTE — Progress Notes (Signed)
Cardiology Office Note   Date:  11/14/2020   ID:  Norva, Bowe [birth year], MRN [id number]  PCP:  Merri Brunette, MD    No chief complaint on file.  Coronary calcifications/CAD  Wt Readings from Last 3 Encounters:  11/14/20 200 lb 3.2 oz (90.8 kg)  12/02/18 199 lb (90.3 kg)  08/16/16 180 lb (81.6 kg)       History of Present Illness: Danielle Stuart is a 69 y.o. female who is being seen today for the evaluation of coronary calficiation at the request of Merri Brunette, MD.  [facility] had a CT scan in 11/2019.  This showed: "The heart size appears within normal limits. Aortic atherosclerosis. Lad and RCA coronary artery calcifications."  She has gained weight over COVID.  She is trying to eat better by cutting carbs.    No real regular exercise.      Past Medical History:  Diagnosis Date  . Allergic rhinitis   . Anxiety   . Aortic atherosclerosis (HCC)   . Atopic dermatitis   . Calcific coronary arteriosclerosis   . Calculus of kidney   . Cardiovascular arteriosclerosis   . Cellulitis   . Chest pain   . Chronic fatigue   . Depression with anxiety   . Epistaxis   . GERD (gastroesophageal reflux disease)   . History of colonic polyps   . Hot flashes   . Hypercholesteremia   . Hypothyroidism   . Iron deficiency anemia   . Leg edema   . Obese   . Prediabetes   . Pulmonary emphysema (HCC)   . RLS (restless legs syndrome)   . Sleep deprivation   . Vitamin D deficiency     Past Surgical History:  Procedure Laterality Date  . BREAST SURGERY     1970's- biopsies of breast- benign   . CARDIAC CATHETERIZATION  2004   told that the results were normal  . CARPAL TUNNEL RELEASE Bilateral   . OPEN REDUCTION INTERNAL FIXATION (ORIF) DISTAL RADIAL FRACTURE Left 08/16/2016   Procedure: OPEN REDUCTION INTERNAL FIXATION (ORIF) LEFT DISTAL RADIAL FRACTURE;  Surgeon: Dairl Ponder, MD;  Location: [facility];  Service: Orthopedics;  Laterality: Left;     Current  Outpatient Medications  Medication Sig Dispense Refill  . cetirizine (ZYRTEC) 10 MG tablet Take 10 mg by mouth daily.    . Cholecalciferol (VITAMIN D3) 50 MCG (2000 UT) TABS Take by mouth.    . furosemide (LASIX) 40 MG tablet Take 40 mg by mouth. Per patient taking 1/2 tab 20 mg    . levothyroxine (SYNTHROID, LEVOTHROID) 50 MCG tablet Take 50 mcg by mouth daily.  4  . omeprazole (PRILOSEC) 20 MG capsule Take 20 mg by mouth daily.  0  . rosuvastatin (CRESTOR) 10 MG tablet Take 10 mg by mouth daily.    . sertraline (ZOLOFT) 100 MG tablet Take 100 mg by mouth daily.  0   No current facility-administered medications for this visit.    Allergies:   Codeine, Cocaine, and Latex    Social History:  The patient  reports that she quit smoking about 12 years ago. Her smoking use included cigarettes. She has a 38.00 pack-year smoking history. She has never used smokeless tobacco. She reports that she does not drink alcohol.   Family History:  The patient's family history includes CAD in her brother and mother; Diabetes in her mother; Heart attack in her brother; Heart disease in her mother; Heart failure in her father.  Father was a smoker.  Brother was alcoholic, had multiple stents.   ROS:  Please see the history of present illness.   Otherwise, review of systems are positive for leg swelling.   All other systems are reviewed and negative.    PHYSICAL EXAM: VS:  BP 140/74   Pulse 72   Ht 5\' 3"  (1.6 m)   Wt 200 lb 3.2 oz (90.8 kg)   SpO2 99%   BMI 35.46 kg/m  , BMI Body mass index is 35.46 kg/m. GEN: Well nourished, well developed, in no acute distress  HEENT: normal  Neck: no JVD, carotid bruits, or masses Cardiac: RRR; no murmurs, rubs, or gallops,no edema  Respiratory:  clear to auscultation bilaterally, normal work of breathing GI: soft, nontender, nondistended, + BS MS: no deformity or atrophy  Skin: warm and dry, no rash Neuro:  Strength and sensation are intact Psych: euthymic  mood, full affect   EKG:   The ekg ordered today demonstrates NSR, no ST segment changes    Recent Labs: No results found for requested labs within last 8760 hours.   Lipid Panel No results found for: CHOL, TRIG, HDL, CHOLHDL, VLDL, LDLCALC, LDLDIRECT   Other studies Reviewed: Additional studies/ records that were reviewed today with results demonstrating: labs from Dr. noted.    ASSESSMENT AND PLAN:  1. Coronary calcification/CAD: No angina.  Continue preventive therapy.  Healthy diet recommended.  We discussed sx to look for. If she develops angina, can reconsider some ischemic testing.  2. Morbid obesity: Whole food plant based diet, high fiber recommended.  3. Hyperlipidemia: LDL 154 in 11/21.  Rosuvastatin was started.  Target LDL < 100.  Healthy diet and 150 minutes/week exercies recommended.  Brisk walking.   4. Elevated blood sugar: 105 in 11/21.  Would check an A1C at some point.  Increased activity and plant based diet avoiding processed foods.  5. PACs: Noted by Dr. 12/21 in the past.  No sx at this time.    Current medicines are reviewed at length with the patient today.  The patient concerns regarding her medicines were addressed.  The following changes have been made:  No change  Labs/ tests ordered today include:  No orders of the defined types were placed in this encounter.   Recommend 150 minutes/week of aerobic exercise Low fat, low carb, high fiber diet recommended  Disposition:   FU in 1 year   Signed, Ladona Ridgel, MD  11/14/2020 2:34 PM    [facility] [address] [place], [place], [place]  [place] Phone: [phone]; Fax: [phone]

## 2020-11-14 NOTE — Patient Instructions (Signed)
Medication Instructions:  Your physician recommends that you continue on your current medications as directed. Please refer to the Current Medication list given to you today.  *If you need a refill on your cardiac medications before your next appointment, please call your pharmacy*   Lab Work: None  If you have labs (blood work) drawn today and your tests are completely normal, you will receive your results only by: Marland Kitchen MyChart Message (if you have MyChart) OR . A paper copy in the mail If you have any lab test that is abnormal or we need to change your treatment, we will call you to review the results.   Testing/Procedures: None   Follow-Up: At [facility], you and your health needs are our priority.  As part of our continuing mission to provide you with exceptional heart care, we have created designated Provider Care Teams.  These Care Teams include your primary Cardiologist (physician) and Advanced Practice Providers (APPs -  Physician Assistants and Nurse Practitioners) who all work together to provide you with the care you need, when you need it.  We recommend signing up for the patient portal called "MyChart".  Sign up information is provided on this After Visit Summary.  MyChart is used to connect with patients for Virtual Visits (Telemedicine).  Patients are able to view lab/test results, encounter notes, upcoming appointments, etc.  Non-urgent messages can be sent to your provider as well.   To learn more about what you can do with MyChart, go to [email or web address].    Your next appointment:   12 month(s)  The format for your next appointment:   In Person  Provider:   You may see Everette Rank, MD or one of the following Advanced Practice Providers on your designated Care Team:    Ronie Spies, PA-C  Jacolyn Reedy, PA-C    Other Instructions Your provider recommends that you maintain 150 minutes per week of moderate aerobic activity.   High-Fiber Diet Fiber,  also called dietary fiber, is a type of carbohydrate that is found in fruits, vegetables, whole grains, and beans. A high-fiber diet can have many health benefits. Your health care provider may recommend a high-fiber diet to help:  Prevent constipation. Fiber can make your bowel movements more regular.  Lower your cholesterol.  Relieve the following conditions: ? Swelling of veins in the anus (hemorrhoids). ? Swelling and irritation (inflammation) of specific areas of the digestive tract (uncomplicated diverticulosis). ? A problem of the large intestine (colon) that sometimes causes pain and diarrhea (irritable bowel syndrome, IBS).  Prevent overeating as part of a weight-loss plan.  Prevent heart disease, type 2 diabetes, and certain cancers. What is my plan? The recommended daily fiber intake in grams (g) includes:  38 g for men age 34 or younger.  30 g for men over age 57.  25 g for women age 34 or younger.  21 g for women over age 63. You can get the recommended daily intake of dietary fiber by:  Eating a variety of fruits, vegetables, grains, and beans.  Taking a fiber supplement, if it is not possible to get enough fiber through your diet. What do I need to know about a high-fiber diet?  It is better to get fiber through food sources rather than from fiber supplements. There is not a lot of research about how effective supplements are.  Always check the fiber content on the nutrition facts label of any prepackaged food. Look for foods that contain 5 g of  fiber or more per serving.  Talk with a diet and nutrition specialist (dietitian) if you have questions about specific foods that are recommended or not recommended for your medical condition, especially if those foods are not listed below.  Gradually increase how much fiber you consume. If you increase your intake of dietary fiber too quickly, you may have bloating, cramping, or gas.  Drink plenty of water. Water helps  you to digest fiber. What are tips for following this plan?  Eat a wide variety of high-fiber foods.  Make sure that half of the grains that you eat each day are whole grains.  Eat breads and cereals that are made with whole-grain flour instead of refined flour or white flour.  Eat brown rice, bulgur wheat, or millet instead of white rice.  Start the day with a breakfast that is high in fiber, such as a cereal that contains 5 g of fiber or more per serving.  Use beans in place of meat in soups, salads, and pasta dishes.  Eat high-fiber snacks, such as berries, raw vegetables, nuts, and popcorn.  Choose whole fruits and vegetables instead of processed forms like juice or sauce. What foods can I eat?  Fruits Berries. Pears. Apples. Oranges. Avocado. Prunes and raisins. Dried figs. Vegetables Sweet potatoes. Spinach. Kale. Artichokes. Cabbage. Broccoli. Cauliflower. Green peas. Carrots. Squash. Grains Whole-grain breads. Multigrain cereal. Oats and oatmeal. Brown rice. Barley. Bulgur wheat. Millet. Quinoa. Bran muffins. Popcorn. Rye wafer crackers. Meats and other proteins Navy, kidney, and pinto beans. Soybeans. Split peas. Lentils. Nuts and seeds. Dairy Fiber-fortified yogurt. Beverages Fiber-fortified soy milk. Fiber-fortified orange juice. Other foods Fiber bars. The items listed above may not be a complete list of recommended foods and beverages. Contact a dietitian for more options. What foods are not recommended? Fruits Fruit juice. Cooked, strained fruit. Vegetables Fried potatoes. Canned vegetables. Well-cooked vegetables. Grains White bread. Pasta made with refined flour. White rice. Meats and other proteins Fatty cuts of meat. Fried chicken or fried fish. Dairy Milk. Yogurt. Cream cheese. Sour cream. Fats and oils Butters. Beverages Soft drinks. Other foods Cakes and pastries. The items listed above may not be a complete list of foods and beverages to  avoid. Contact a dietitian for more information. Summary  Fiber is a type of carbohydrate. It is found in fruits, vegetables, whole grains, and beans.  There are many health benefits of eating a high-fiber diet, such as preventing constipation, lowering blood cholesterol, helping with weight loss, and reducing your risk of heart disease, diabetes, and certain cancers.  Gradually increase your intake of fiber. Increasing too fast can result in cramping, bloating, and gas. Drink plenty of water while you increase your fiber.  The best sources of fiber include whole fruits and vegetables, whole grains, nuts, seeds, and beans. This information is not intended to replace advice given to you by your health care provider. Make sure you discuss any questions you have with your health care provider. Document Revised: 10/13/2017 Document Reviewed: 10/13/2017 Elsevier Patient Education  2020 [employer].

## 2020-11-20 DIAGNOSIS — L235 Allergic contact dermatitis due to other chemical products: Secondary | ICD-10-CM | POA: Diagnosis not present

## 2020-12-01 ENCOUNTER — Ambulatory Visit (INDEPENDENT_AMBULATORY_CARE_PROVIDER_SITE_OTHER)
Admission: RE | Admit: 2020-12-01 | Discharge: 2020-12-01 | Disposition: A | Discharge status: Home / Self Care | Payer: Medicare Other | Source: Ambulatory Visit | Attending: Acute Care | Admitting: Acute Care

## 2020-12-01 ENCOUNTER — Other Ambulatory Visit: Payer: Self-pay

## 2020-12-01 DIAGNOSIS — Z122 Encounter for screening for malignant neoplasm of respiratory organs: Secondary | ICD-10-CM

## 2020-12-01 DIAGNOSIS — Z87891 Personal history of nicotine dependence: Secondary | ICD-10-CM

## 2020-12-08 ENCOUNTER — Other Ambulatory Visit: Payer: Self-pay | Admitting: *Deleted

## 2020-12-08 DIAGNOSIS — Z87891 Personal history of nicotine dependence: Secondary | ICD-10-CM

## 2020-12-08 NOTE — Progress Notes (Signed)
Please call patient and let them  know their  low dose Ct was read as a Lung RADS 2: nodules that are benign in appearance and behavior with a very low likelihood of becoming a clinically active cancer due to size or lack of growth. Recommendation per radiology is for a repeat LDCT in 12 months. .Please let them  know we will order and schedule their  annual screening scan for 11/2021. Please let them  know there was notation of CAD on their  scan.  Please remind the patient  that this is a non-gated exam therefore degree or severity of disease  cannot be determined. Please have them  follow up with their PCP regarding potential risk factor modification, dietary therapy or pharmacologic therapy if clinically indicated. Pt.  is  currently on statin therapy. Please place order for annual  screening scan for  11/2021 and fax results to PCP. Thanks so much. 

## 2020-12-08 NOTE — Progress Notes (Signed)
This patient is followed by cards. Thanks

## 2020-12-11 DIAGNOSIS — I2510 Atherosclerotic heart disease of native coronary artery without angina pectoris: Secondary | ICD-10-CM | POA: Diagnosis not present

## 2020-12-11 DIAGNOSIS — R748 Abnormal levels of other serum enzymes: Secondary | ICD-10-CM | POA: Diagnosis not present

## 2021-03-07 DIAGNOSIS — L258 Unspecified contact dermatitis due to other agents: Secondary | ICD-10-CM | POA: Diagnosis not present

## 2021-04-23 DIAGNOSIS — R040 Epistaxis: Secondary | ICD-10-CM | POA: Diagnosis not present

## 2021-04-23 DIAGNOSIS — J342 Deviated nasal septum: Secondary | ICD-10-CM | POA: Diagnosis not present

## 2021-04-23 DIAGNOSIS — J343 Hypertrophy of nasal turbinates: Secondary | ICD-10-CM | POA: Diagnosis not present

## 2021-05-28 DIAGNOSIS — R040 Epistaxis: Secondary | ICD-10-CM | POA: Diagnosis not present

## 2021-07-03 DIAGNOSIS — R040 Epistaxis: Secondary | ICD-10-CM | POA: Diagnosis not present

## 2021-08-24 DIAGNOSIS — Z1231 Encounter for screening mammogram for malignant neoplasm of breast: Secondary | ICD-10-CM | POA: Diagnosis not present

## 2021-09-03 IMAGING — CT CT CHEST LUNG CANCER SCREENING LOW DOSE W/O CM
2 of 3 series · 15 of 36 positions shown, 18 images · non-contrast
Comparison: 11/04/2018

CLINICAL DATA: Lung cancer screening. Thirty-eight pack-year
history. Former asymptomatic smoker.

EXAM:
CT CHEST WITHOUT CONTRAST LOW-DOSE FOR LUNG CANCER SCREENING
TECHNIQUE: Multidetector CT imaging of the chest was performed following the
standard protocol without IV contrast.

[Series 2: thorax 5.0 i31f 3 · axial · 0.69mm/px · z∈[-340,-75]mm · 12 of 63 slices shown, 15 images]
[im 5/63  mediastinal]
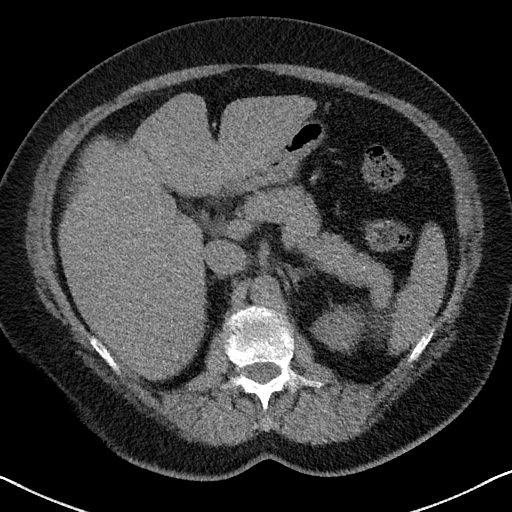
[im 5/63  lung]
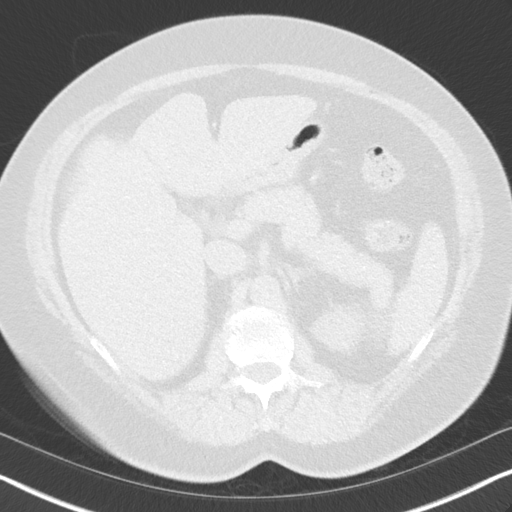
[im 10/63  lung]
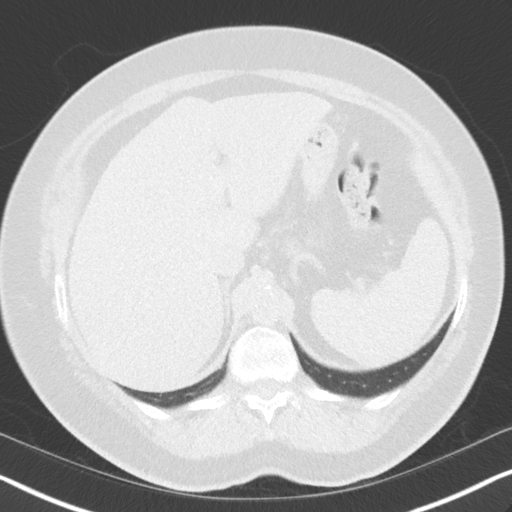
[im 14/63  lung]
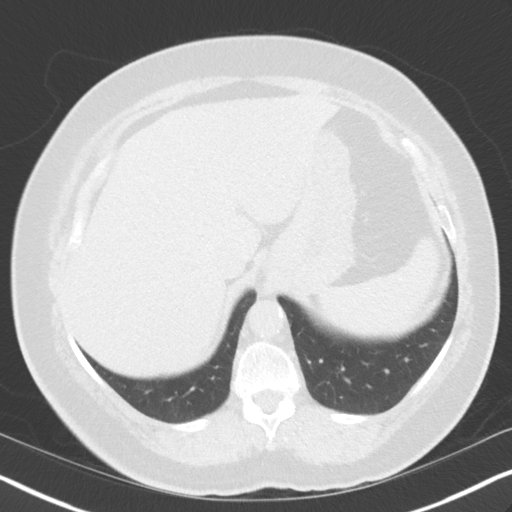
[im 19/63  lung]
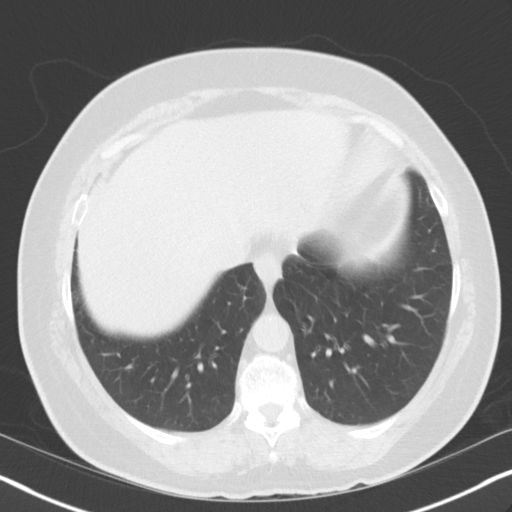
[im 23/63  mediastinal]
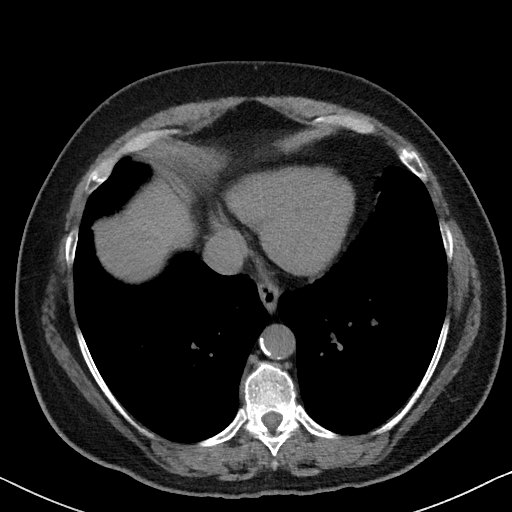
[im 23/63  lung]
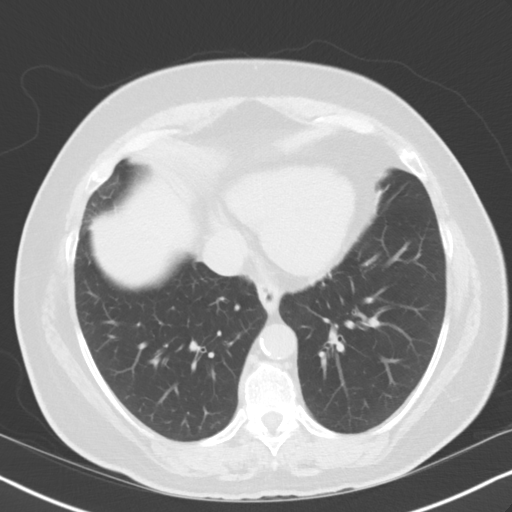
[im 28/63  lung]
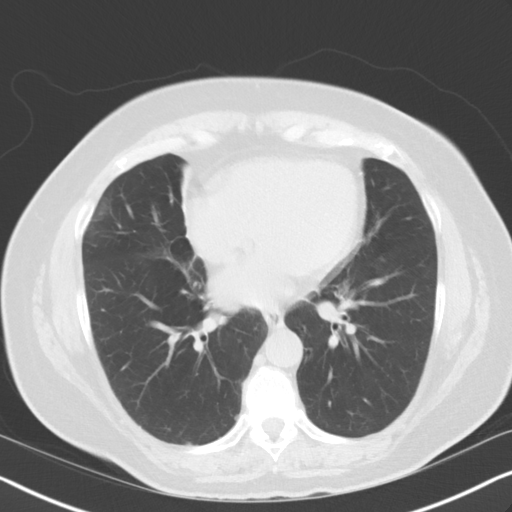
[im 35/63  lung]
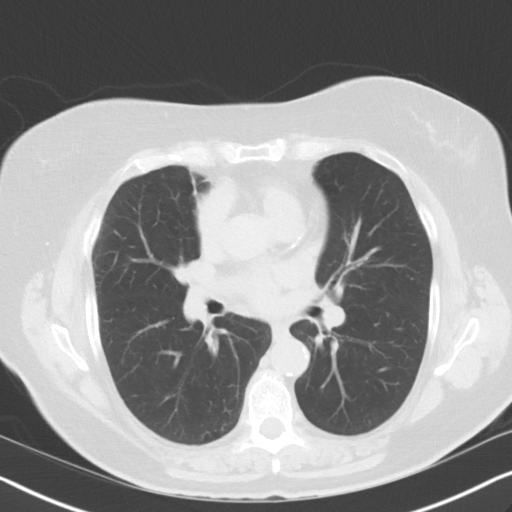
[im 40/63  lung]
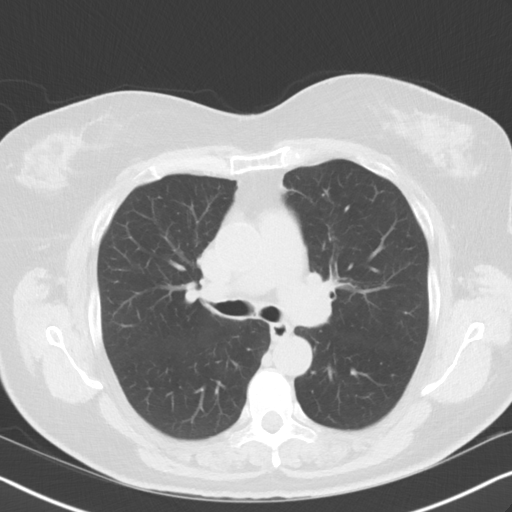
[im 44/63  mediastinal]
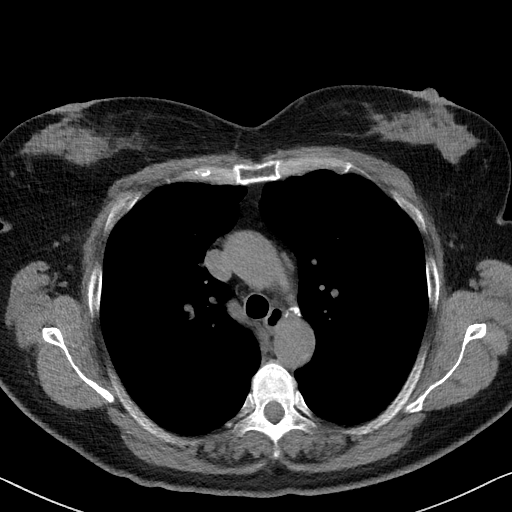
[im 44/63  lung]
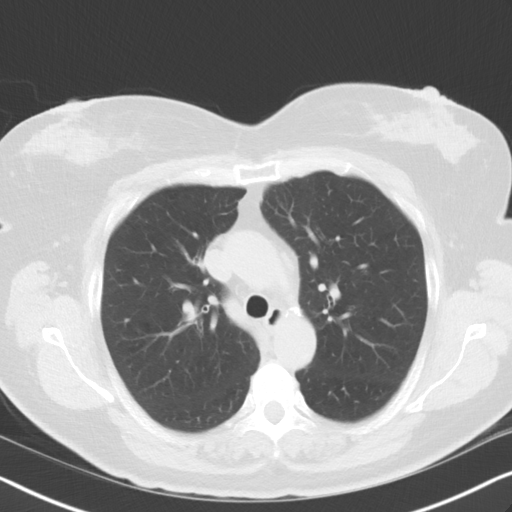
[im 49/63  lung]
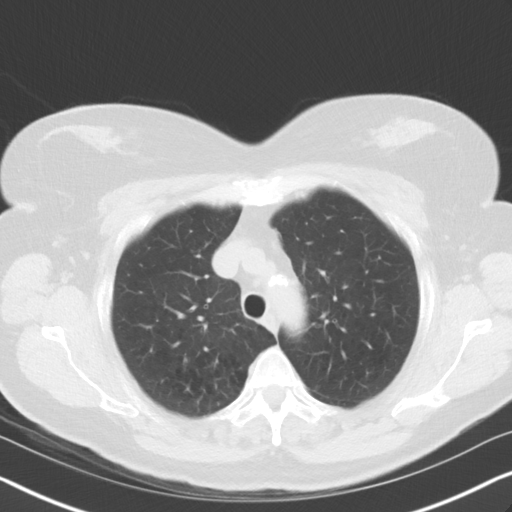
[im 53/63  lung]
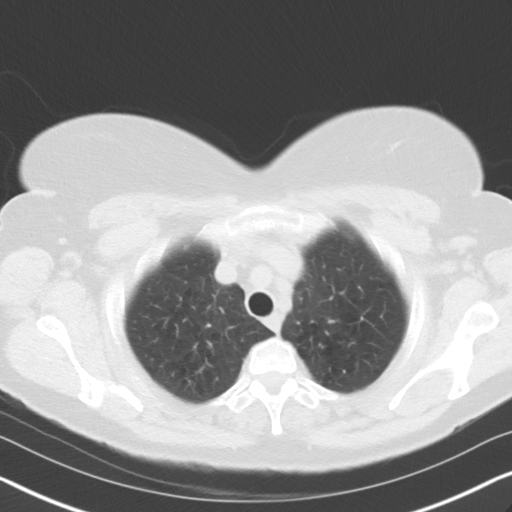
[im 58/63  lung]
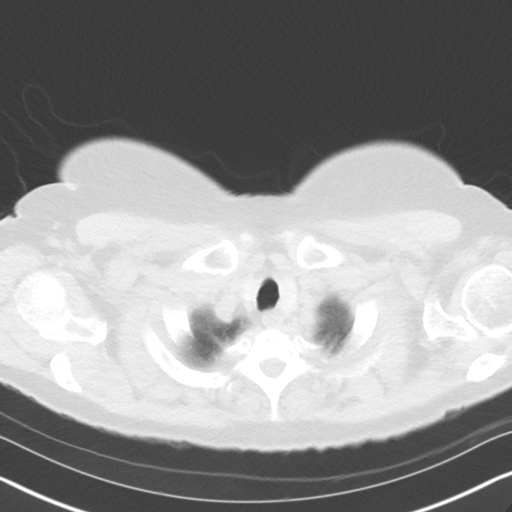

[Series 5: coronal · coronal · 0.61mm/px · 3 of 126 slices shown]
[im 26/126  lung]
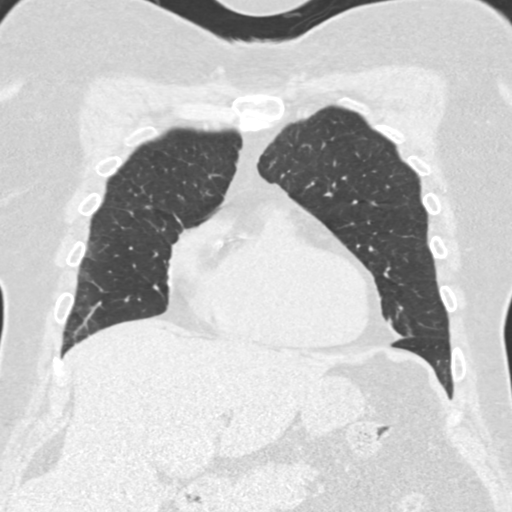
[im 51/126  lung]
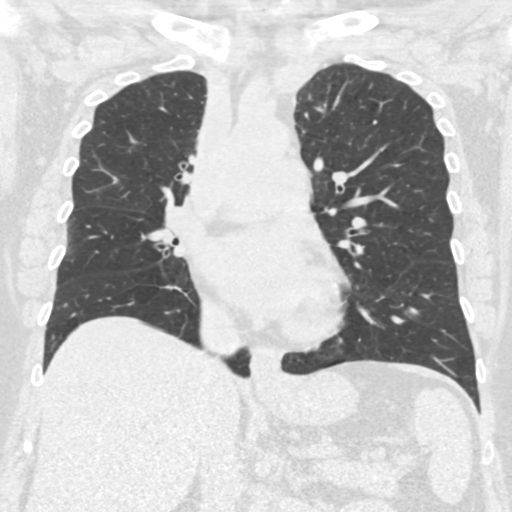
[im 76/126  lung]
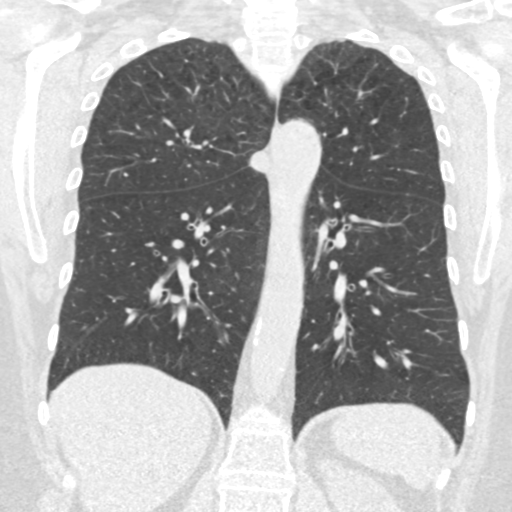

[15 of 36 positions shown; findings below may reference images not displayed]

FINDINGS: Cardiovascular: The heart size appears within normal limits. Aortic
atherosclerosis. Lad and RCA coronary artery calcifications.

Mediastinum/Nodes: No enlarged mediastinal, hilar, or axillary lymph
nodes. Thyroid gland, trachea, and esophagus demonstrate no
significant findings.

Lungs/Pleura: Centrilobular emphysema identified. No pleural
effusion, airspace consolidation or atelectasis. Unchanged
noncalcified subpleural nodule in the posteromedial left upper lobe
with an equivalent diameter of 4.6 mm. No new lung nodules
identified

Upper Abdomen: No acute abnormality.

Musculoskeletal: No chest wall mass or suspicious bone lesions
identified.
IMPRESSION: 1. Lung-RADS 2, benign appearance or behavior. Continue annual
screening with low-dose chest CT without contrast in 12 months.
2. Emphysema and aortic atherosclerosis. Multi vessel coronary
artery calcifications noted.

Aortic Atherosclerosis (6YYRH-DMK.K) and Emphysema (6YYRH-LWC.8).

## 2021-09-04 DIAGNOSIS — I83813 Varicose veins of bilateral lower extremities with pain: Secondary | ICD-10-CM | POA: Diagnosis not present

## 2021-09-04 DIAGNOSIS — I8311 Varicose veins of right lower extremity with inflammation: Secondary | ICD-10-CM | POA: Diagnosis not present

## 2021-09-04 DIAGNOSIS — I83893 Varicose veins of bilateral lower extremities with other complications: Secondary | ICD-10-CM | POA: Diagnosis not present

## 2021-09-04 DIAGNOSIS — I8312 Varicose veins of left lower extremity with inflammation: Secondary | ICD-10-CM | POA: Diagnosis not present

## 2021-09-07 DIAGNOSIS — I8312 Varicose veins of left lower extremity with inflammation: Secondary | ICD-10-CM | POA: Diagnosis not present

## 2021-09-07 DIAGNOSIS — I83893 Varicose veins of bilateral lower extremities with other complications: Secondary | ICD-10-CM | POA: Diagnosis not present

## 2021-09-07 DIAGNOSIS — I83813 Varicose veins of bilateral lower extremities with pain: Secondary | ICD-10-CM | POA: Diagnosis not present

## 2021-09-07 DIAGNOSIS — I8311 Varicose veins of right lower extremity with inflammation: Secondary | ICD-10-CM | POA: Diagnosis not present

## 2021-10-29 DIAGNOSIS — E559 Vitamin D deficiency, unspecified: Secondary | ICD-10-CM | POA: Diagnosis not present

## 2021-10-29 DIAGNOSIS — E039 Hypothyroidism, unspecified: Secondary | ICD-10-CM | POA: Diagnosis not present

## 2021-10-29 DIAGNOSIS — E7800 Pure hypercholesterolemia, unspecified: Secondary | ICD-10-CM | POA: Diagnosis not present

## 2021-10-29 DIAGNOSIS — Z0000 Encounter for general adult medical examination without abnormal findings: Secondary | ICD-10-CM | POA: Diagnosis not present

## 2021-10-30 DIAGNOSIS — I83891 Varicose veins of right lower extremities with other complications: Secondary | ICD-10-CM | POA: Diagnosis not present

## 2021-10-30 DIAGNOSIS — I872 Venous insufficiency (chronic) (peripheral): Secondary | ICD-10-CM | POA: Diagnosis not present

## 2021-11-01 DIAGNOSIS — I2510 Atherosclerotic heart disease of native coronary artery without angina pectoris: Secondary | ICD-10-CM | POA: Diagnosis not present

## 2021-11-01 DIAGNOSIS — K219 Gastro-esophageal reflux disease without esophagitis: Secondary | ICD-10-CM | POA: Diagnosis not present

## 2021-11-01 DIAGNOSIS — J439 Emphysema, unspecified: Secondary | ICD-10-CM | POA: Diagnosis not present

## 2021-11-01 DIAGNOSIS — R600 Localized edema: Secondary | ICD-10-CM | POA: Diagnosis not present

## 2021-11-01 DIAGNOSIS — Z0000 Encounter for general adult medical examination without abnormal findings: Secondary | ICD-10-CM | POA: Diagnosis not present

## 2021-11-01 DIAGNOSIS — I700 Atherosclerosis of aorta: Secondary | ICD-10-CM | POA: Diagnosis not present

## 2021-11-01 DIAGNOSIS — R350 Frequency of micturition: Secondary | ICD-10-CM | POA: Diagnosis not present

## 2021-11-01 DIAGNOSIS — Z23 Encounter for immunization: Secondary | ICD-10-CM | POA: Diagnosis not present

## 2021-11-01 DIAGNOSIS — J309 Allergic rhinitis, unspecified: Secondary | ICD-10-CM | POA: Diagnosis not present

## 2021-11-01 DIAGNOSIS — L209 Atopic dermatitis, unspecified: Secondary | ICD-10-CM | POA: Diagnosis not present

## 2021-11-01 DIAGNOSIS — E039 Hypothyroidism, unspecified: Secondary | ICD-10-CM | POA: Diagnosis not present

## 2021-11-06 DIAGNOSIS — Z09 Encounter for follow-up examination after completed treatment for conditions other than malignant neoplasm: Secondary | ICD-10-CM | POA: Diagnosis not present

## 2021-11-06 DIAGNOSIS — I83891 Varicose veins of right lower extremities with other complications: Secondary | ICD-10-CM | POA: Diagnosis not present

## 2021-11-06 DIAGNOSIS — I872 Venous insufficiency (chronic) (peripheral): Secondary | ICD-10-CM | POA: Diagnosis not present

## 2021-11-08 DIAGNOSIS — I83892 Varicose veins of left lower extremities with other complications: Secondary | ICD-10-CM | POA: Diagnosis not present

## 2021-11-08 DIAGNOSIS — I872 Venous insufficiency (chronic) (peripheral): Secondary | ICD-10-CM | POA: Diagnosis not present

## 2021-11-12 DIAGNOSIS — I83891 Varicose veins of right lower extremities with other complications: Secondary | ICD-10-CM | POA: Diagnosis not present

## 2021-11-12 DIAGNOSIS — Z09 Encounter for follow-up examination after completed treatment for conditions other than malignant neoplasm: Secondary | ICD-10-CM | POA: Diagnosis not present

## 2021-11-12 DIAGNOSIS — I83813 Varicose veins of bilateral lower extremities with pain: Secondary | ICD-10-CM | POA: Diagnosis not present

## 2021-11-13 DIAGNOSIS — H26493 Other secondary cataract, bilateral: Secondary | ICD-10-CM | POA: Diagnosis not present

## 2021-11-13 DIAGNOSIS — H52203 Unspecified astigmatism, bilateral: Secondary | ICD-10-CM | POA: Diagnosis not present

## 2021-11-20 DIAGNOSIS — Z09 Encounter for follow-up examination after completed treatment for conditions other than malignant neoplasm: Secondary | ICD-10-CM | POA: Diagnosis not present

## 2021-11-20 DIAGNOSIS — I872 Venous insufficiency (chronic) (peripheral): Secondary | ICD-10-CM | POA: Diagnosis not present

## 2021-11-20 DIAGNOSIS — I83892 Varicose veins of left lower extremities with other complications: Secondary | ICD-10-CM | POA: Diagnosis not present

## 2021-11-26 DIAGNOSIS — I83811 Varicose veins of right lower extremities with pain: Secondary | ICD-10-CM | POA: Diagnosis not present

## 2021-11-26 DIAGNOSIS — I83891 Varicose veins of right lower extremities with other complications: Secondary | ICD-10-CM | POA: Diagnosis not present

## 2021-11-26 DIAGNOSIS — M7989 Other specified soft tissue disorders: Secondary | ICD-10-CM | POA: Diagnosis not present

## 2021-12-04 ENCOUNTER — Ambulatory Visit (INDEPENDENT_AMBULATORY_CARE_PROVIDER_SITE_OTHER)
Admission: RE | Admit: 2021-12-04 | Discharge: 2021-12-04 | Disposition: A | Discharge status: Home / Self Care | Payer: Medicare Other | Source: Ambulatory Visit | Attending: Acute Care | Admitting: Acute Care

## 2021-12-04 ENCOUNTER — Other Ambulatory Visit: Payer: Self-pay

## 2021-12-04 DIAGNOSIS — Z87891 Personal history of nicotine dependence: Secondary | ICD-10-CM

## 2021-12-04 DIAGNOSIS — I83892 Varicose veins of left lower extremities with other complications: Secondary | ICD-10-CM | POA: Diagnosis not present

## 2021-12-04 DIAGNOSIS — I87392 Chronic venous hypertension (idiopathic) with other complications of left lower extremity: Secondary | ICD-10-CM | POA: Diagnosis not present

## 2021-12-12 ENCOUNTER — Other Ambulatory Visit: Payer: Self-pay | Admitting: Acute Care

## 2021-12-12 DIAGNOSIS — Z87891 Personal history of nicotine dependence: Secondary | ICD-10-CM

## 2021-12-18 DIAGNOSIS — M7989 Other specified soft tissue disorders: Secondary | ICD-10-CM | POA: Diagnosis not present

## 2021-12-18 DIAGNOSIS — I83892 Varicose veins of left lower extremities with other complications: Secondary | ICD-10-CM | POA: Diagnosis not present

## 2021-12-18 DIAGNOSIS — I83812 Varicose veins of left lower extremities with pain: Secondary | ICD-10-CM | POA: Diagnosis not present

## 2022-01-01 ENCOUNTER — Ambulatory Visit: Payer: Medicare Other | Admitting: Interventional Cardiology

## 2022-01-17 DIAGNOSIS — L308 Other specified dermatitis: Secondary | ICD-10-CM | POA: Diagnosis not present

## 2022-01-17 DIAGNOSIS — L258 Unspecified contact dermatitis due to other agents: Secondary | ICD-10-CM | POA: Diagnosis not present

## 2022-01-18 DIAGNOSIS — I83813 Varicose veins of bilateral lower extremities with pain: Secondary | ICD-10-CM | POA: Diagnosis not present

## 2022-01-18 DIAGNOSIS — I872 Venous insufficiency (chronic) (peripheral): Secondary | ICD-10-CM | POA: Diagnosis not present

## 2022-01-18 DIAGNOSIS — I87393 Chronic venous hypertension (idiopathic) with other complications of bilateral lower extremity: Secondary | ICD-10-CM | POA: Diagnosis not present

## 2022-01-18 DIAGNOSIS — M7989 Other specified soft tissue disorders: Secondary | ICD-10-CM | POA: Diagnosis not present

## 2022-02-07 NOTE — Progress Notes (Signed)
Cardiology Office Note   Date:  02/08/2022   ID:  Danielle Stuart, Danielle Stuart 11-20-1951, MRN [id number]  PCP:  Merri Brunette, MD    No chief complaint on file.    Wt Readings from Last 3 Encounters:  02/08/22 194 lb 12.8 oz (88.4 kg)  11/14/20 200 lb 3.2 oz (90.8 kg)  12/02/18 199 lb (90.3 kg)       History of Present Illness: Danielle Stuart is a 71 y.o. female  with coronary artery calcification.   SHe had a CT scan in 11/2019.  This showed: "The heart size appears within normal limits. Aortic atherosclerosis. Lad and RCA coronary artery calcifications."   She has gained weight over COVID.  Denies : Chest pain. Dizziness. Leg edema. Nitroglycerin use. Orthopnea. Palpitations. Paroxysmal nocturnal dyspnea. Shortness of breath. Syncope.    Tries to walk some.  Will do more when the weather is better.   Past Medical History:  Diagnosis Date   Allergic rhinitis    Anxiety    Aortic atherosclerosis (HCC)    Atopic dermatitis    Calcific coronary arteriosclerosis    Calculus of kidney    Cardiovascular arteriosclerosis    Cellulitis    Chest pain    Chronic fatigue    Depression with anxiety    Epistaxis    GERD (gastroesophageal reflux disease)    History of colonic polyps    Hot flashes    Hypercholesteremia    Hypothyroidism    Iron deficiency anemia    Leg edema    Obese    Prediabetes    Pulmonary emphysema (HCC)    RLS (restless legs syndrome)    Sleep deprivation    Vitamin D deficiency     Past Surgical History:  Procedure Laterality Date   BREAST SURGERY     1970's- biopsies of breast- benign    CARDIAC CATHETERIZATION  2004   told that the results were normal   CARPAL TUNNEL RELEASE Bilateral    OPEN REDUCTION INTERNAL FIXATION (ORIF) DISTAL RADIAL FRACTURE Left 08/16/2016   Procedure: OPEN REDUCTION INTERNAL FIXATION (ORIF) LEFT DISTAL RADIAL FRACTURE;  Surgeon: Dairl Ponder, MD;  Location: [facility] OR;  Service: Orthopedics;  Laterality: Left;      Current Outpatient Medications  Medication Sig Dispense Refill   aspirin 81 MG chewable tablet Chew 1 tablet by mouth daily.     cetirizine (ZYRTEC) 10 MG tablet Take 10 mg by mouth daily.     cetirizine (ZYRTEC) 10 MG tablet Take 1 tablet by mouth daily.     Cholecalciferol (VITAMIN D3) 50 MCG (2000 UT) TABS Take by mouth.     furosemide (LASIX) 40 MG tablet Take 40 mg by mouth. Per patient taking 1/2 tab 20 mg     levothyroxine (SYNTHROID, LEVOTHROID) 50 MCG tablet Take 50 mcg by mouth daily.  4   omeprazole (PRILOSEC) 20 MG capsule Take 20 mg by mouth daily.  0   rosuvastatin (CRESTOR) 10 MG tablet Take 10 mg by mouth daily.     sertraline (ZOLOFT) 100 MG tablet Take 100 mg by mouth daily.  0   triamcinolone cream (KENALOG) 0.1 % as needed.     No current facility-administered medications for this visit.    Allergies:   Codeine, Cocaine, and Latex    Social History:  The patient  reports that she quit smoking about 14 years ago. Her smoking use included cigarettes. She has a 38.00 pack-year smoking history. She has never  used smokeless tobacco. She reports that she does not drink alcohol.   Family History:  The patient's family history includes CAD in her brother and mother; Diabetes in her mother; Heart attack in her brother; Heart disease in her mother; Heart failure in her father.    ROS:  Please see the history of present illness.   Otherwise, review of systems are positive for edema improved, intentional weight loss.   All other systems are reviewed and negative.    PHYSICAL EXAM: VS:  BP 132/72    Pulse 66    Ht 5\' 3"  (1.6 m)    Wt 194 lb 12.8 oz (88.4 kg)    SpO2 96%    BMI 34.51 kg/m  , BMI Body mass index is 34.51 kg/m. GEN: Well nourished, well developed, in no acute distress HEENT: normal Neck: no JVD, carotid bruits, or masses Cardiac: RRR; no murmurs, rubs, or gallops,no edema  Respiratory:  clear to auscultation bilaterally, normal work of breathing GI:  soft, nontender, nondistended, + BS MS: no deformity or atrophy Skin: warm and dry, no rash Neuro:  Strength and sensation are intact Psych: euthymic mood, full affect   EKG:   The ekg ordered today demonstrates normal ECG   Recent Labs: No results found for requested labs within last 8760 hours.   Lipid Panel No results found for: CHOL, TRIG, HDL, CHOLHDL, VLDL, LDLCALC, LDLDIRECT   Other studies Reviewed: Additional studies/ records that were reviewed today with results demonstrating: labs reviewed.   ASSESSMENT AND PLAN:  Coronary artery calcification/CAD: No angina. Continue aggressive preventive therapy.  Morbid obesity: Noted in the past. trying to lose weight. BMI below 35 now. Hyperlipidemia: TC 140 in 10/2021.  Elevated blood sugar: noted in the past. recent A1C not available.  Followed by Dr. 11/2021 most recently in 10/2021.  Result not available. PACs: Followed by Dr. 11/2021 in the past. LE edema: Had venous ablation done. Edema improved.    Current medicines are reviewed at length with the patient today.  The patient concerns regarding her medicines were addressed.  The following changes have been made:  No change  Labs/ tests ordered today include:  No orders of the defined types were placed in this encounter.   Recommend 150 minutes/week of aerobic exercise Low fat, low carb, high fiber diet recommended  Disposition:   FU in 1 year   Signed, Ladona Ridgel, MD  02/08/2022 11:27 AM    [facility] [address] [place], [place], [place]  [place] Phone: [phone]; Fax: [phone]

## 2022-02-08 ENCOUNTER — Encounter: Payer: Self-pay | Admitting: Interventional Cardiology

## 2022-02-08 ENCOUNTER — Ambulatory Visit: Payer: Medicare Other | Admitting: Interventional Cardiology

## 2022-02-08 ENCOUNTER — Other Ambulatory Visit: Payer: Self-pay

## 2022-02-08 VITALS — BP 132/72 | HR 66 | Ht 63.0 in | Wt 194.8 lb

## 2022-02-08 DIAGNOSIS — I2510 Atherosclerotic heart disease of native coronary artery without angina pectoris: Secondary | ICD-10-CM

## 2022-02-08 DIAGNOSIS — I491 Atrial premature depolarization: Secondary | ICD-10-CM

## 2022-02-08 DIAGNOSIS — E782 Mixed hyperlipidemia: Secondary | ICD-10-CM

## 2022-02-08 DIAGNOSIS — R739 Hyperglycemia, unspecified: Secondary | ICD-10-CM

## 2022-02-08 NOTE — Patient Instructions (Signed)
Medication Instructions:  Your physician recommends that you continue on your current medications as directed. Please refer to the Current Medication list given to you today.  *If you need a refill on your cardiac medications before your next appointment, please call your pharmacy*   Lab Work: none If you have labs (blood work) drawn today and your tests are completely normal, you will receive your results only by: MyChart Message (if you have MyChart) OR A paper copy in the mail If you have any lab test that is abnormal or we need to change your treatment, we will call you to review the results.   Testing/Procedures: none   Follow-Up: At [facility], you and your health needs are our priority.  As part of our continuing mission to provide you with exceptional heart care, we have created designated Provider Care Teams.  These Care Teams include your primary Cardiologist (physician) and Advanced Practice Providers (APPs -  Physician Assistants and Nurse Practitioners) who all work together to provide you with the care you need, when you need it.  We recommend signing up for the patient portal called "MyChart".  Sign up information is provided on this After Visit Summary.  MyChart is used to connect with patients for Virtual Visits (Telemedicine).  Patients are able to view lab/test results, encounter notes, upcoming appointments, etc.  Non-urgent messages can be sent to your provider as well.   To learn more about what you can do with MyChart, go to [email or web address].    Your next appointment:   12 month(s)  The format for your next appointment:   In Person  Provider:   Dr Eldridge Dace  If primary card or EP is not listed click here to update    :1}    Other Instructions High-Fiber Eating Plan Fiber, also called dietary fiber, is a type of carbohydrate. It is found foods such as fruits, vegetables, whole grains, and beans. A high-fiber diet can have many health benefits.  Your health care provider may recommend a high-fiber diet to help: Prevent constipation. Fiber can make your bowel movements more regular. Lower your cholesterol. Relieve the following conditions: Inflammation of veins in the anus (hemorrhoids). Inflammation of specific areas of the digestive tract (uncomplicated diverticulosis). A problem of the large intestine, also called the colon, that sometimes causes pain and diarrhea (irritable bowel syndrome, or IBS). Prevent overeating as part of a weight-loss plan. Prevent heart disease, type 2 diabetes, and certain cancers. What are tips for following this plan? Reading food labels  Check the nutrition facts label on food products for the amount of dietary fiber. Choose foods that have 5 grams of fiber or more per serving. The goals for recommended daily fiber intake include: Men (age 65 or younger): 34-38 g. Men (over age 93): 28-34 g. Women (age 28 or younger): 25-28 g. Women (over age 30): 22-25 g. Your daily fiber goal is _____________ g. Shopping Choose whole fruits and vegetables instead of processed forms, such as apple juice or applesauce. Choose a wide variety of high-fiber foods such as avocados, lentils, oats, and kidney beans. Read the nutrition facts label of the foods you choose. Be aware of foods with added fiber. These foods often have high sugar and sodium amounts per serving. Cooking Use whole-grain flour for baking and cooking. Cook with brown rice instead of white rice. Meal planning Start the day with a breakfast that is high in fiber, such as a cereal that contains 5 g of fiber or more  per serving. Eat breads and cereals that are made with whole-grain flour instead of refined flour or white flour. Eat brown rice, bulgur wheat, or millet instead of white rice. Use beans in place of meat in soups, salads, and pasta dishes. Be sure that half of the grains you eat each day are whole grains. General information You can  get the recommended daily intake of dietary fiber by: Eating a variety of fruits, vegetables, grains, nuts, and beans. Taking a fiber supplement if you are not able to take in enough fiber in your diet. It is better to get fiber through food than from a supplement. Gradually increase how much fiber you consume. If you increase your intake of dietary fiber too quickly, you may have bloating, cramping, or gas. Drink plenty of water to help you digest fiber. Choose high-fiber snacks, such as berries, raw vegetables, nuts, and popcorn. What foods should I eat? Fruits Berries. Pears. Apples. Oranges. Avocado. Prunes and raisins. Dried figs. Vegetables Sweet potatoes. Spinach. Kale. Artichokes. Cabbage. Broccoli. Cauliflower. Green peas. Carrots. Squash. Grains Whole-grain breads. Multigrain cereal. Oats and oatmeal. Brown rice. Barley. Bulgur wheat. Millet. Quinoa. Bran muffins. Popcorn. Rye wafer crackers. Meats and other proteins Navy beans, kidney beans, and pinto beans. Soybeans. Split peas. Lentils. Nuts and seeds. Dairy Fiber-fortified yogurt. Beverages Fiber-fortified soy milk. Fiber-fortified orange juice. Other foods Fiber bars. The items listed above may not be a complete list of recommended foods and beverages. Contact a dietitian for more information. What foods should I avoid? Fruits Fruit juice. Cooked, strained fruit. Vegetables Fried potatoes. Canned vegetables. Well-cooked vegetables. Grains White bread. Pasta made with refined flour. White rice. Meats and other proteins Fatty cuts of meat. Fried chicken or fried fish. Dairy Milk. Yogurt. Cream cheese. Sour cream. Fats and oils Butters. Beverages Soft drinks. Other foods Cakes and pastries. The items listed above may not be a complete list of foods and beverages to avoid. Talk with your dietitian about what choices are best for you. Summary Fiber is a type of carbohydrate. It is found in foods such as fruits,  vegetables, whole grains, and beans. A high-fiber diet has many benefits. It can help to prevent constipation, lower blood cholesterol, aid weight loss, and reduce your risk of heart disease, diabetes, and certain cancers. Increase your intake of fiber gradually. Increasing fiber too quickly may cause cramping, bloating, and gas. Drink plenty of water while you increase the amount of fiber you consume. The best sources of fiber include whole fruits and vegetables, whole grains, nuts, seeds, and beans. This information is not intended to replace advice given to you by your health care provider. Make sure you discuss any questions you have with your health care provider. Document Revised: 04/13/2020 Document Reviewed: 04/13/2020 Elsevier Patient Education  2022 [employer].

## 2022-02-19 ENCOUNTER — Ambulatory Visit: Payer: Medicare Other | Admitting: Interventional Cardiology

## 2022-06-19 ENCOUNTER — Emergency Department (HOSPITAL_COMMUNITY)
Admission: EM | Admit: 2022-06-19 | Discharge: 2022-06-19 | Disposition: A | Discharge status: Home / Self Care | Payer: Medicare Other | Attending: Emergency Medicine | Admitting: Emergency Medicine

## 2022-06-19 ENCOUNTER — Encounter (HOSPITAL_COMMUNITY): Payer: Self-pay

## 2022-06-19 DIAGNOSIS — Z91040 Latex allergy status: Secondary | ICD-10-CM | POA: Insufficient documentation

## 2022-06-19 DIAGNOSIS — S8992XA Unspecified injury of left lower leg, initial encounter: Secondary | ICD-10-CM | POA: Diagnosis present

## 2022-06-19 DIAGNOSIS — Z7982 Long term (current) use of aspirin: Secondary | ICD-10-CM | POA: Insufficient documentation

## 2022-06-19 DIAGNOSIS — S81832A Puncture wound without foreign body, left lower leg, initial encounter: Secondary | ICD-10-CM | POA: Diagnosis not present

## 2022-06-19 DIAGNOSIS — W5501XA Bitten by cat, initial encounter: Secondary | ICD-10-CM | POA: Insufficient documentation

## 2022-06-19 DIAGNOSIS — S81852A Open bite, left lower leg, initial encounter: Secondary | ICD-10-CM | POA: Diagnosis not present

## 2022-06-19 MED ORDER — AMOXICILLIN-POT CLAVULANATE 875-125 MG PO TABS
1.0000 | ORAL_TABLET | Freq: Once | ORAL | Status: AC
  Administered 2022-06-19: 1 via ORAL
  Filled 2022-06-19: qty 1

## 2022-06-19 MED ORDER — AMOXICILLIN-POT CLAVULANATE 875-125 MG PO TABS: 1.0000 | ORAL_TABLET | Freq: Two times a day (BID) | ORAL | 0 refills | Status: DC

## 2022-06-19 NOTE — Discharge Instructions (Addendum)
Your exam today was overall reassuring.  I have sent antibiotic into the pharmacy to cover you for skin infection following the cat bite.  We also discussed rabies shot.  Given you have access to the cat and stays mostly in your yard you have agreed to defer rabies vaccination today.  Contact animal control and they will monitor the cat for any signs of rabies.  If there is any concern for rabies you will be contacted to return for the vaccination.  He also states that you were bit by this cat a few months ago and did not have an infection or other concerns from the wound.  I recommend you follow-up with your primary care doctor towards the end of this week, or early next week to have the wound reevaluated.

## 2022-06-19 NOTE — ED Triage Notes (Signed)
Pt states that on Saturday she was bitten on the L shin by an outdoor cat. Unknown vaccination status of animal. Pt has some noted redness and swelling at site.

## 2022-06-19 NOTE — ED Provider Notes (Signed)
[facility]-EMERGENCY DEPT Provider Note   CSN: [id number] Arrival date & time: 06/19/22  1718     History  Chief Complaint  Patient presents with   Animal Bite    TARISHA FADER is a 71 y.o. female.  Lafayette Dragon , a 71 y.o. female  was evaluated in triage.  Pt complains of cat bite to left lower extremity that occurred Friday.  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  Unsure of cat's vaccination status.  Patient states cat still visits patient's house almost daily.  States that following the incident she squeezed out as much blood as she could and cleaned out the wound. She states she last received her tetanus shot last November.  Denies fever.  Noticed redness surrounding the bite him as of today.  The history is provided by the patient and the spouse. No language interpreter was used.       Home Medications Prior to Admission medications   Medication Sig Start Date End Date Taking? Authorizing Provider  aspirin 81 MG chewable tablet Chew 1 tablet by mouth daily. 11/01/21   [provider]  cetirizine (ZYRTEC) 10 MG tablet Take 10 mg by mouth daily.    [provider]  cetirizine (ZYRTEC) 10 MG tablet Take 1 tablet by mouth daily.    [provider]  Cholecalciferol (VITAMIN D3) 50 MCG (2000 UT) TABS Take by mouth.    [provider]  furosemide (LASIX) 40 MG tablet Take 40 mg by mouth. Per patient taking 1/2 tab 20 mg    [provider]  levothyroxine (SYNTHROID, LEVOTHROID) 50 MCG tablet Take 50 mcg by mouth daily. 11/25/18   [provider]  omeprazole (PRILOSEC) 20 MG capsule Take 20 mg by mouth daily. 07/23/16   [provider]  rosuvastatin (CRESTOR) 10 MG tablet Take 10 mg by mouth daily.    [provider]  sertraline (ZOLOFT) 100 MG tablet Take 100 mg by mouth daily. 07/04/16   [provider]  triamcinolone cream (KENALOG) 0.1 % as needed. 01/18/22   [provider]       Allergies    Codeine, Cocaine, and Latex    Review of Systems   Review of Systems  Constitutional:  Negative for chills and fever.  Skin:  Positive for wound.  All other systems reviewed and are negative.   Physical Exam Updated Vital Signs BP (!) 162/76 (BP Location: Left Arm)   Pulse 78   Temp 98.2 F (36.8 C) (Oral)   Resp 19   SpO2 95%  Physical Exam Vitals and nursing note reviewed.  Constitutional:      General: She is not in acute distress.    Appearance: Normal appearance. She is not ill-appearing.  HENT:     Head: Normocephalic and atraumatic.     Nose: Nose normal.  Eyes:     Conjunctiva/sclera: Conjunctivae normal.  Pulmonary:     Effort: Pulmonary effort is normal. No respiratory distress.  Musculoskeletal:        General: No deformity.  Skin:    Findings: No rash.     Comments: 4 puncture wounds noted to the left lower extremity.  Mild erythema noted around one of the puncture wounds.  She is able ambulate without difficulty.  Soft left lower extremity compartments.  Neurological:     Mental Status: She is alert.     ED Results / Procedures / Treatments   Labs (all labs ordered are listed, but only  abnormal results are displayed) Labs Reviewed - No data to display  EKG None  Radiology No results found.  Procedures Procedures    Medications Ordered in ED Medications - No data to display  ED Course/ Medical Decision Making/ A&P                           Medical Decision Making Risk Prescription drug management.   71 year old female presents today for evaluation of a cat bite.  This occurred Friday morning.  Today she started noticing redness around the puncture wound.  Unsure of cat's vaccination status.  However Primarily stays in patient's yard.  Animal control paperwork filled out and faxed.  Discussed with patient to call animal control tomorrow to follow-up with them.  She also states prior to discharge that she was bit by this cat  few months ago in her right hand.  Did not have infection, prophylactic antibiotics, or other issues from the wound.  Did not receive rabies vaccinations at that time either.  We did have discussion with rabies vaccination and patient after shared decision-making and would like to defer this at this time and allowing control to quarantine and monitor.  If she has any concerns she will return to the emergency room.  Augmentin prescribed.  First dose given in the emergency room.  Patient is appropriate for discharge.  Discharged in stable condition.   Final Clinical Impression(s) / ED Diagnoses Final diagnoses:  Cat bite, initial encounter    Rx / DC Orders ED Discharge Orders          Ordered    amoxicillin-clavulanate (AUGMENTIN) 875-125 MG tablet  Every 12 hours        06/19/22 1902              Marita Kansas, PA-C 06/19/22 1904    Sloan Leiter, DO 06/22/22 0021

## 2022-06-19 NOTE — ED Provider Triage Note (Signed)
Emergency Medicine Provider Triage Evaluation Note  Danielle Stuart , a 71 y.o. female  was evaluated in triage.  Pt complains of cat bite to left lower extremity that occurred Friday.  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  Unsure of cat's vaccination status.  Patient states cat still visits patient's house almost daily.  She states she last received her tetanus shot last November.  Denies fever.  Noticed redness surrounding the bite him as of today.  Review of Systems  Positive: As above Negative: as above  Physical Exam  BP (!) 162/76 (BP Location: Left Arm)   Pulse 78   Temp 98.2 F (36.8 C) (Oral)   Resp 19   SpO2 95%  Gen:   Awake, no distress   Resp:  Normal effort  MSK:   Moves extremities without difficulty  Other:  For puncture wounds noted to the left lower extremity.  No open wounds.  Erythema noted around the puncture wound.  Medical Decision Making  Medically screening exam initiated at 5:37 PM.  Appropriate orders placed.  KELTY SZAFRAN was informed that the remainder of the evaluation will be completed by another provider, this initial triage assessment does not replace that evaluation, and the importance of remaining in the ED until their evaluation is complete.     Marita Kansas, PA-C 06/19/22 1739

## 2022-06-21 ENCOUNTER — Encounter (HOSPITAL_COMMUNITY): Payer: Self-pay

## 2022-06-21 ENCOUNTER — Emergency Department (HOSPITAL_COMMUNITY)
Admission: EM | Admit: 2022-06-21 | Discharge: 2022-06-21 | Disposition: A | Discharge status: Home / Self Care | Payer: Medicare Other | Attending: Emergency Medicine | Admitting: Emergency Medicine

## 2022-06-21 DIAGNOSIS — L03116 Cellulitis of left lower limb: Secondary | ICD-10-CM | POA: Insufficient documentation

## 2022-06-21 DIAGNOSIS — Z91040 Latex allergy status: Secondary | ICD-10-CM | POA: Insufficient documentation

## 2022-06-21 DIAGNOSIS — L0390 Cellulitis, unspecified: Secondary | ICD-10-CM

## 2022-06-21 DIAGNOSIS — Z7982 Long term (current) use of aspirin: Secondary | ICD-10-CM | POA: Insufficient documentation

## 2022-06-21 DIAGNOSIS — Z79899 Other long term (current) drug therapy: Secondary | ICD-10-CM | POA: Insufficient documentation

## 2022-06-21 DIAGNOSIS — E039 Hypothyroidism, unspecified: Secondary | ICD-10-CM | POA: Diagnosis not present

## 2022-06-21 DIAGNOSIS — I2510 Atherosclerotic heart disease of native coronary artery without angina pectoris: Secondary | ICD-10-CM | POA: Diagnosis not present

## 2022-06-21 LAB — CBC WITH DIFFERENTIAL/PLATELET
Abs Immature Granulocytes: 0.01 10*3/uL (ref 0.00–0.07)
Basophils Absolute: 0 10*3/uL (ref 0.0–0.1)
Basophils Relative: 0 %
Eosinophils Absolute: 0.2 10*3/uL (ref 0.0–0.5)
Eosinophils Relative: 2 %
HCT: 36.8 % (ref 36.0–46.0)
Hemoglobin: 12 g/dL (ref 12.0–15.0)
Immature Granulocytes: 0 %
Lymphocytes Relative: 15 %
Lymphs Abs: 1.3 10*3/uL (ref 0.7–4.0)
MCH: 30.6 pg (ref 26.0–34.0)
MCHC: 32.6 g/dL (ref 30.0–36.0)
MCV: 93.9 fL (ref 80.0–100.0)
Monocytes Absolute: 0.5 10*3/uL (ref 0.1–1.0)
Monocytes Relative: 6 %
Neutro Abs: 6.4 10*3/uL (ref 1.7–7.7)
Neutrophils Relative %: 77 %
Platelets: 201 10*3/uL (ref 150–400)
RBC: 3.92 MIL/uL (ref 3.87–5.11)
RDW: 13.2 % (ref 11.5–15.5)
WBC: 8.4 10*3/uL (ref 4.0–10.5)
nRBC: 0 % (ref 0.0–0.2)

## 2022-06-21 LAB — BASIC METABOLIC PANEL
Anion gap: 9 (ref 5–15)
BUN: 10 mg/dL (ref 8–23)
CO2: 24 mmol/L (ref 22–32)
Calcium: 8.9 mg/dL (ref 8.9–10.3)
Chloride: 104 mmol/L (ref 98–111)
Creatinine, Ser: 0.62 mg/dL (ref 0.44–1.00)
GFR, Estimated: >60 mL/min (ref >60)
Glucose, Bld: 110 mg/dL — ABNORMAL HIGH (ref 70–99)
Potassium: 3.7 mmol/L (ref 3.5–5.1)
Sodium: 137 mmol/L (ref 135–145)

## 2022-06-21 MED ORDER — ONDANSETRON 4 MG PO TBDP: 4.0000 mg | ORAL_TABLET | Freq: Three times a day (TID) | ORAL | 0 refills | Status: DC | PRN

## 2022-06-21 MED ORDER — CLINDAMYCIN HCL 300 MG PO CAPS
300.0000 mg | ORAL_CAPSULE | Freq: Once | ORAL | Status: AC
  Administered 2022-06-21: 300 mg via ORAL
  Filled 2022-06-21: qty 1

## 2022-06-21 MED ORDER — ONDANSETRON 4 MG PO TBDP
4.0000 mg | ORAL_TABLET | Freq: Once | ORAL | Status: AC
  Administered 2022-06-21: 4 mg via ORAL
  Filled 2022-06-21: qty 1

## 2022-06-21 MED ORDER — CLINDAMYCIN HCL 150 MG PO CAPS: 300.0000 mg | ORAL_CAPSULE | Freq: Four times a day (QID) | ORAL | 0 refills | Status: AC

## 2022-06-21 NOTE — ED Provider Triage Note (Signed)
Emergency Medicine Provider Triage Evaluation Note  Danielle Stuart , a 71 y.o. female  was evaluated in triage.  Pt complains of left lower extremity redness and swelling.  She was seen 2 days ago for a cat bite to her left lower extremity.  She was prescribed Augmentin.  She has taken a total of 4 doses.  She has been having vomiting after taking the antibiotic.  She states that she feels warm but has not had a fever.  She does report history of venous insufficiency in the lower extremities and has had vein ablations.  Review of Systems  Positive: Erythema Negative: Fever  Physical Exam  BP (!) 159/82   Pulse 84   Temp 98.5 F (36.9 C) (Oral)   Resp 18   Ht 5\' 3"  (1.6 m)   Wt 90.7 kg   SpO2 96%   BMI 35.43 kg/m  Gen:   Awake, no distress   Resp:  Normal effort  MSK:   Moves extremities without difficulty  Other:  Erythema and warmth from the proximal left lower leg down to the ankle.  No abscess or active drainage.  Wounds from bite appear to be healing and are scabbed over.  Medical Decision Making  Medically screening exam initiated at 5:39 PM.  Appropriate orders placed.  Danielle Stuart was informed that the remainder of the evaluation will be completed by another provider, this initial triage assessment does not replace that evaluation, and the importance of remaining in the ED until their evaluation is complete.     Lafayette Dragon, PA-C 06/21/22 1740

## 2022-06-21 NOTE — ED Provider Notes (Signed)
  [facility]-EMERGENCY DEPT Provider Note   CSN: [id number] Arrival date & time: 06/21/22  1645     History {Add pertinent medical, surgical, social history, OB history to HPI:1} No chief complaint on file.   Danielle Stuart is a 71 y.o. female.  HPI     Home Medications Prior to Admission medications   Medication Sig Start Date End Date Taking? Authorizing Provider  amoxicillin-clavulanate (AUGMENTIN) 875-125 MG tablet Take 1 tablet by mouth every 12 (twelve) hours. 06/19/22   Marita Kansas, PA-C  aspirin 81 MG chewable tablet Chew 1 tablet by mouth daily. 11/01/21   [provider]  cetirizine (ZYRTEC) 10 MG tablet Take 10 mg by mouth daily.    [provider]  cetirizine (ZYRTEC) 10 MG tablet Take 1 tablet by mouth daily.    [provider]  Cholecalciferol (VITAMIN D3) 50 MCG (2000 UT) TABS Take by mouth.    [provider]  furosemide (LASIX) 40 MG tablet Take 40 mg by mouth. Per patient taking 1/2 tab 20 mg    [provider]  levothyroxine (SYNTHROID, LEVOTHROID) 50 MCG tablet Take 50 mcg by mouth daily. 11/25/18   [provider]  omeprazole (PRILOSEC) 20 MG capsule Take 20 mg by mouth daily. 07/23/16   [provider]  rosuvastatin (CRESTOR) 10 MG tablet Take 10 mg by mouth daily.    [provider]  sertraline (ZOLOFT) 100 MG tablet Take 100 mg by mouth daily. 07/04/16   [provider]  triamcinolone cream (KENALOG) 0.1 % as needed. 01/18/22   [provider]      Allergies    Codeine, Cocaine, and Latex    Review of Systems   Review of Systems  Physical Exam Updated Vital Signs BP 134/72 (BP Location: Left Arm)   Pulse 78   Temp 99.8 F (37.7 C) (Oral)   Resp 18   Ht 5\' 3"  (1.6 m)   Wt 92.1 kg   SpO2 94%   BMI 35.96 kg/m  Physical Exam  ED Results / Procedures / Treatments   Labs (all labs ordered are listed, but only abnormal results are  displayed) Labs Reviewed  BASIC METABOLIC PANEL - Abnormal; Notable for the following components:      Result Value   Glucose, Bld 110 (*)    All other components within normal limits  CBC WITH DIFFERENTIAL/PLATELET    EKG None  Radiology No results found.  Procedures Procedures  {Document cardiac monitor, telemetry assessment procedure when appropriate:1}  Medications Ordered in ED Medications - No data to display  ED Course/ Medical Decision Making/ A&P                           Medical Decision Making  ***  {Document critical care time when appropriate:1} {Document review of labs and clinical decision tools ie heart score, Chads2Vasc2 etc:1}  {Document your independent review of radiology images, and any outside records:1} {Document your discussion with family members, caretakers, and with consultants:1} {Document social determinants of health affecting pt's care:1} {Document your decision making why or why not admission, treatments were needed:1} Final Clinical Impression(s) / ED Diagnoses Final diagnoses:  None    Rx / DC Orders ED Discharge Orders     None

## 2022-06-21 NOTE — ED Triage Notes (Signed)
Pt arrived via POV. States she is currently on abx for cat bite. Has not been able to tolerate medications without vomiting.

## 2022-06-24 DIAGNOSIS — K121 Other forms of stomatitis: Secondary | ICD-10-CM | POA: Diagnosis not present

## 2022-06-24 DIAGNOSIS — L03116 Cellulitis of left lower limb: Secondary | ICD-10-CM | POA: Diagnosis not present

## 2022-07-23 DIAGNOSIS — I87392 Chronic venous hypertension (idiopathic) with other complications of left lower extremity: Secondary | ICD-10-CM | POA: Diagnosis not present

## 2022-07-23 DIAGNOSIS — R600 Localized edema: Secondary | ICD-10-CM | POA: Diagnosis not present

## 2022-07-23 DIAGNOSIS — I872 Venous insufficiency (chronic) (peripheral): Secondary | ICD-10-CM | POA: Diagnosis not present

## 2022-07-23 DIAGNOSIS — R252 Cramp and spasm: Secondary | ICD-10-CM | POA: Diagnosis not present

## 2022-09-04 DIAGNOSIS — R300 Dysuria: Secondary | ICD-10-CM | POA: Diagnosis not present

## 2022-09-10 IMAGING — CT CT CHEST LUNG CANCER SCREENING LOW DOSE W/O CM
2 of 4 series · 15 of 36 positions shown, 18 images · non-contrast
Comparison: 11/25/2019 screening chest CT.

CLINICAL DATA: 69-year-old asymptomatic female former smoker with
30 pack-year smoking history, quit smoking 12 years prior.

EXAM:
CT CHEST WITHOUT CONTRAST LOW-DOSE FOR LUNG CANCER SCREENING
TECHNIQUE: Multidetector CT imaging of the chest was performed following the
standard protocol without IV contrast.

[Series 3: lung thins 1.0 · axial · 0.67mm/px · z∈[-346,-48]mm · 12 of 328 slices shown, 15 images]
[im 15/328  mediastinal]
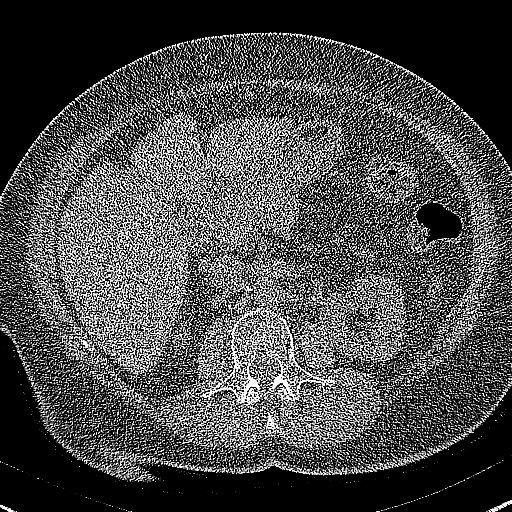
[im 15/328  lung]
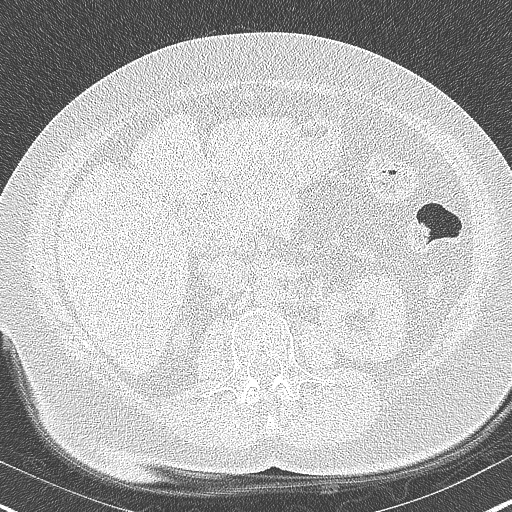
[im 45/328  lung]
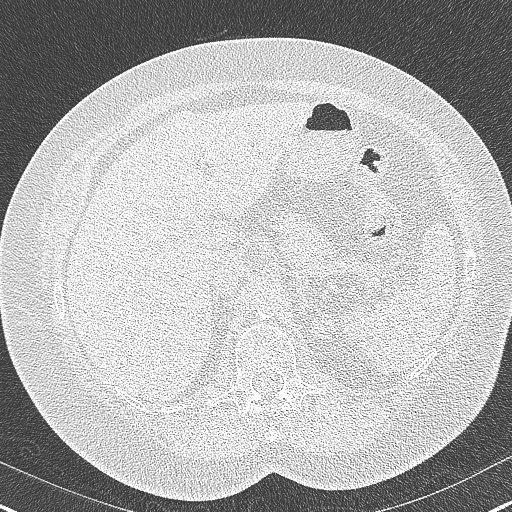
[im 75/328  lung]
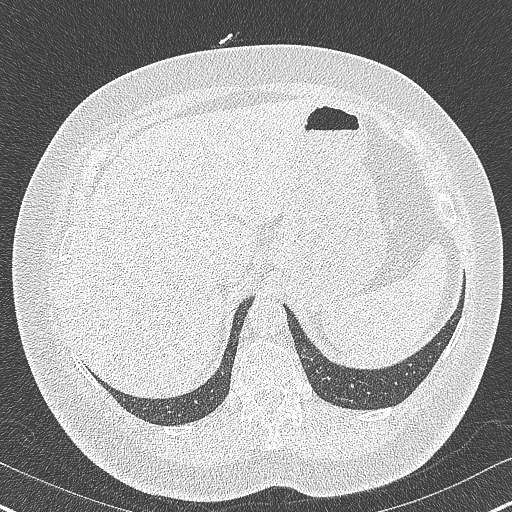
[im 105/328  lung]
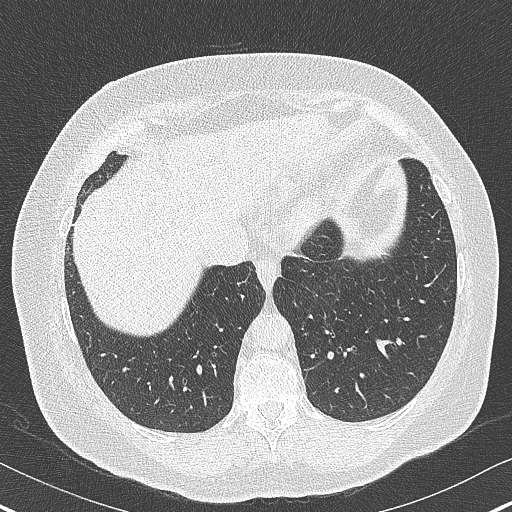
[im 119/328  mediastinal]
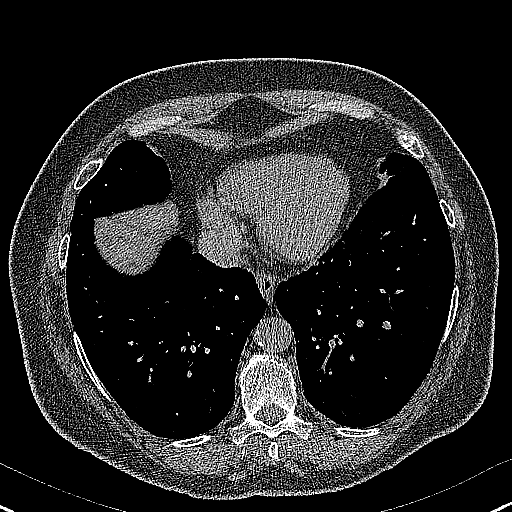
[im 119/328  lung]
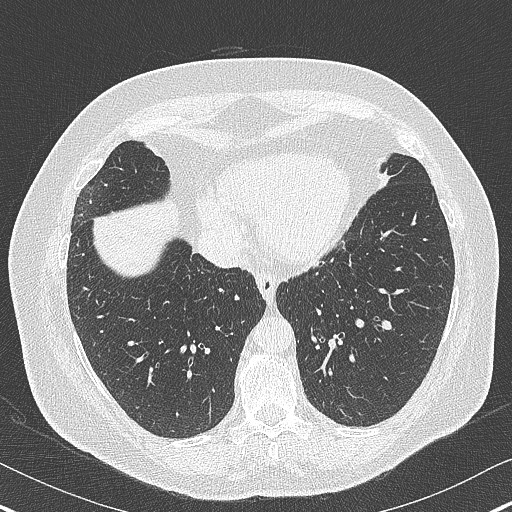
[im 149/328  lung]
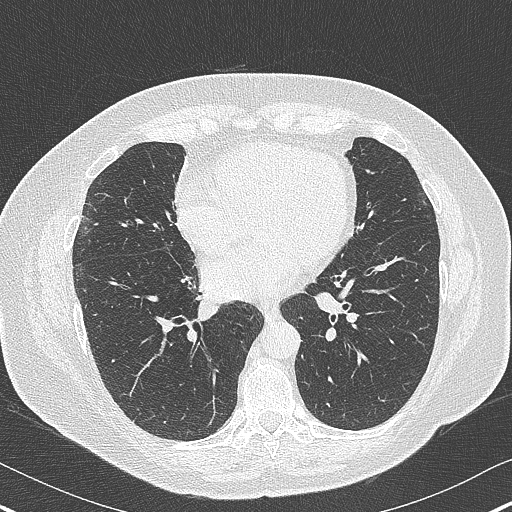
[im 179/328  lung]
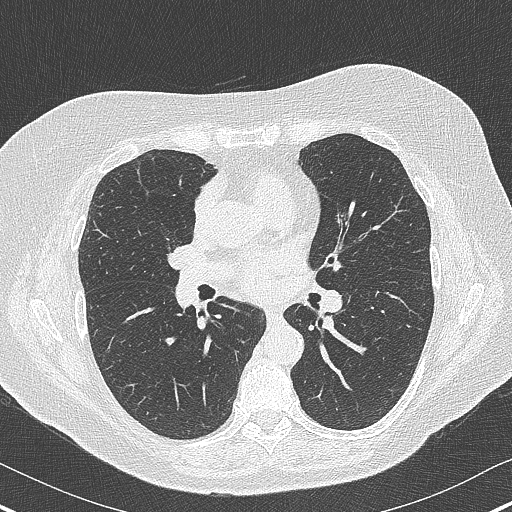
[im 209/328  lung]
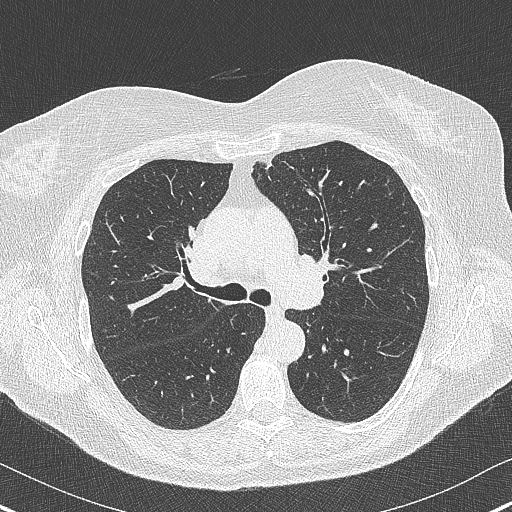
[im 223/328  mediastinal]
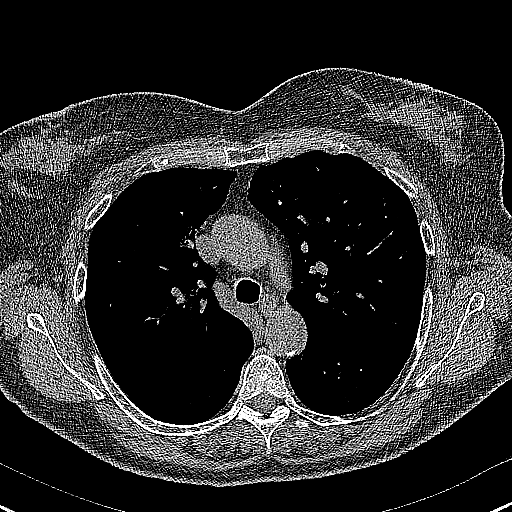
[im 223/328  lung]
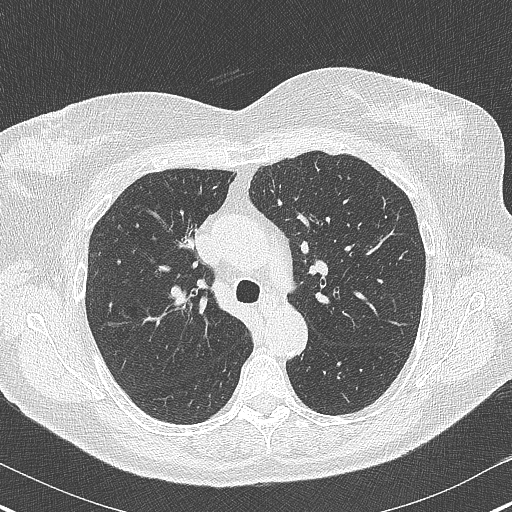
[im 253/328  lung]
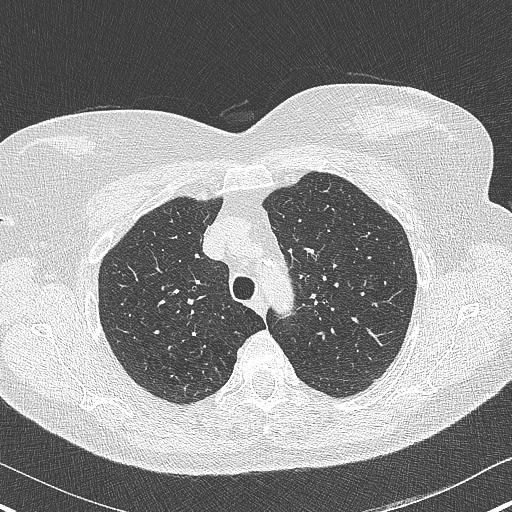
[im 283/328  lung]
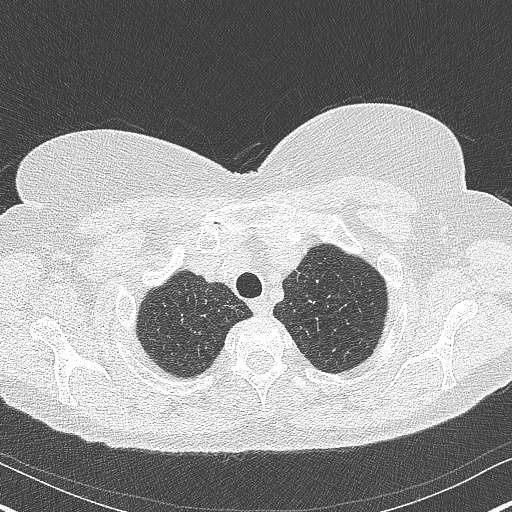
[im 313/328  lung]
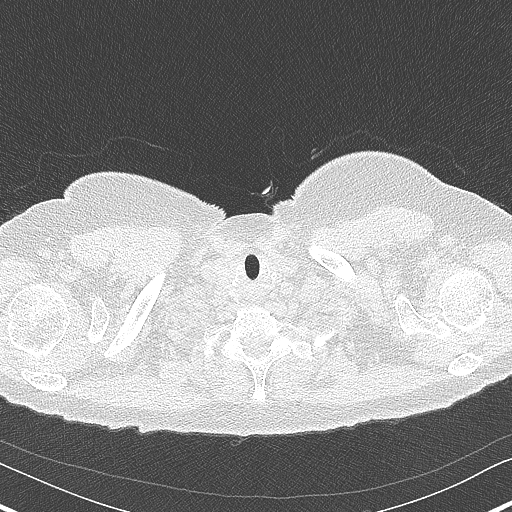

[Series 5: coronal · coronal · 0.64mm/px · 3 of 141 slices shown]
[im 29/141  lung]
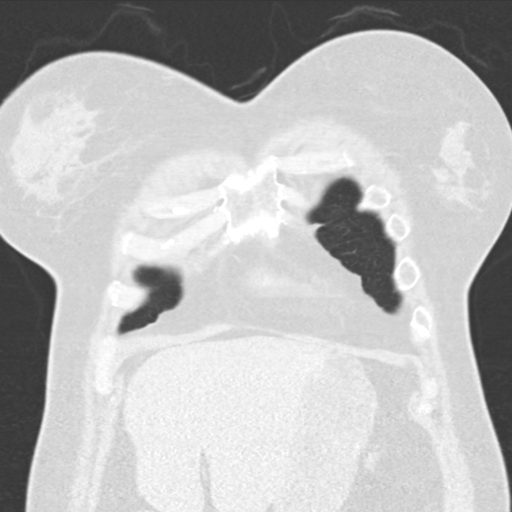
[im 57/141  lung]
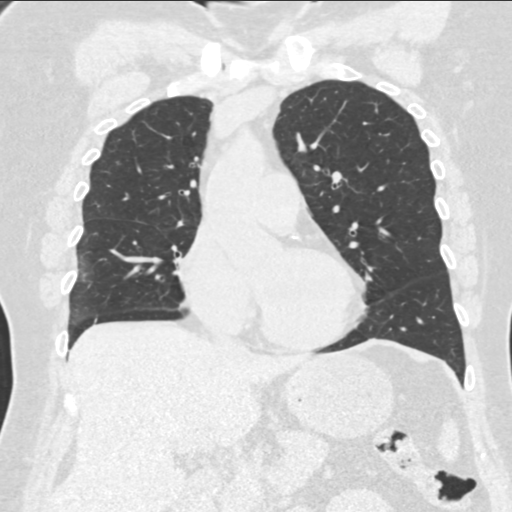
[im 85/141  lung]
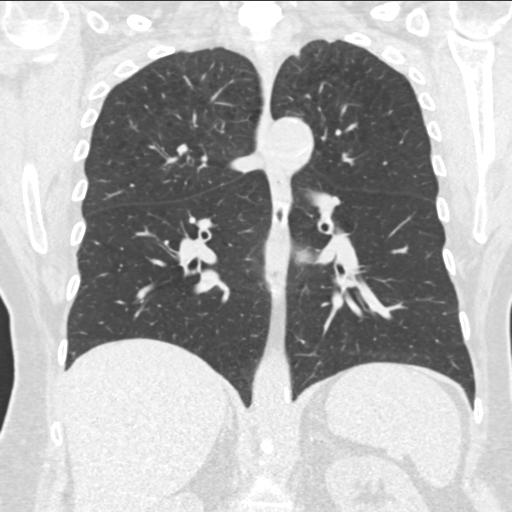

[15 of 36 positions shown; findings below may reference images not displayed]

FINDINGS: Cardiovascular: Normal heart size. No significant pericardial
effusion/thickening. Left anterior descending and right coronary
atherosclerosis. Atherosclerotic nonaneurysmal thoracic aorta.
Normal caliber pulmonary arteries.

Mediastinum/Nodes: No discrete thyroid nodules. Unremarkable
esophagus. No pathologically enlarged axillary, mediastinal or hilar
lymph nodes, noting limited sensitivity for the detection of hilar
adenopathy on this noncontrast study.

Lungs/Pleura: No pneumothorax. No pleural effusion. Moderate
centrilobular emphysema with diffuse bronchial wall thickening. No
acute consolidative airspace disease or lung masses. New peripheral
basilar left lower lobe solid pulmonary nodule measuring 3.6 mm in
volume derived mean diameter (series 3/image 243). No additional new
significant pulmonary nodules. Previously visualized posterior left
upper lobe nodule is stable.

Upper abdomen: Exophytic simple 1.7 cm upper left renal cysts.

Musculoskeletal: No aggressive appearing focal osseous lesions.
Moderate thoracic spondylosis.
IMPRESSION: 1. Lung-RADS 2, benign appearance or behavior. Continue annual
screening with low-dose chest CT without contrast in 12 months.
2. Two vessel coronary atherosclerosis.
3. Aortic Atherosclerosis (GUPO5-7CA.A) and Emphysema (GUPO5-H6U.S).

## 2022-09-13 DIAGNOSIS — Z1231 Encounter for screening mammogram for malignant neoplasm of breast: Secondary | ICD-10-CM | POA: Diagnosis not present

## 2022-09-18 DIAGNOSIS — Z23 Encounter for immunization: Secondary | ICD-10-CM | POA: Diagnosis not present

## 2022-10-31 DIAGNOSIS — E039 Hypothyroidism, unspecified: Secondary | ICD-10-CM | POA: Diagnosis not present

## 2022-10-31 DIAGNOSIS — E7800 Pure hypercholesterolemia, unspecified: Secondary | ICD-10-CM | POA: Diagnosis not present

## 2022-10-31 DIAGNOSIS — R7303 Prediabetes: Secondary | ICD-10-CM | POA: Diagnosis not present

## 2022-11-04 DIAGNOSIS — E039 Hypothyroidism, unspecified: Secondary | ICD-10-CM | POA: Diagnosis not present

## 2022-11-04 DIAGNOSIS — I2510 Atherosclerotic heart disease of native coronary artery without angina pectoris: Secondary | ICD-10-CM | POA: Diagnosis not present

## 2022-11-04 DIAGNOSIS — I700 Atherosclerosis of aorta: Secondary | ICD-10-CM | POA: Diagnosis not present

## 2022-11-04 DIAGNOSIS — R600 Localized edema: Secondary | ICD-10-CM | POA: Diagnosis not present

## 2022-11-04 DIAGNOSIS — Z1211 Encounter for screening for malignant neoplasm of colon: Secondary | ICD-10-CM | POA: Diagnosis not present

## 2022-11-04 DIAGNOSIS — Z0000 Encounter for general adult medical examination without abnormal findings: Secondary | ICD-10-CM | POA: Diagnosis not present

## 2022-11-04 DIAGNOSIS — J439 Emphysema, unspecified: Secondary | ICD-10-CM | POA: Diagnosis not present

## 2022-11-12 DIAGNOSIS — I872 Venous insufficiency (chronic) (peripheral): Secondary | ICD-10-CM | POA: Diagnosis not present

## 2022-11-26 DIAGNOSIS — Z961 Presence of intraocular lens: Secondary | ICD-10-CM | POA: Diagnosis not present

## 2022-11-26 DIAGNOSIS — H52203 Unspecified astigmatism, bilateral: Secondary | ICD-10-CM | POA: Diagnosis not present

## 2022-11-27 DIAGNOSIS — M810 Age-related osteoporosis without current pathological fracture: Secondary | ICD-10-CM | POA: Diagnosis not present

## 2022-12-04 ENCOUNTER — Ambulatory Visit
Admission: RE | Admit: 2022-12-04 | Discharge: 2022-12-04 | Disposition: A | Discharge status: Home / Self Care | Payer: Medicare Other | Source: Ambulatory Visit | Attending: Acute Care | Admitting: Acute Care

## 2022-12-04 DIAGNOSIS — E279 Disorder of adrenal gland, unspecified: Secondary | ICD-10-CM | POA: Diagnosis not present

## 2022-12-04 DIAGNOSIS — N281 Cyst of kidney, acquired: Secondary | ICD-10-CM | POA: Diagnosis not present

## 2022-12-04 DIAGNOSIS — I2510 Atherosclerotic heart disease of native coronary artery without angina pectoris: Secondary | ICD-10-CM | POA: Diagnosis not present

## 2022-12-04 DIAGNOSIS — Z87891 Personal history of nicotine dependence: Secondary | ICD-10-CM

## 2022-12-05 ENCOUNTER — Telehealth: Payer: Self-pay | Admitting: Acute Care

## 2022-12-05 ENCOUNTER — Other Ambulatory Visit: Payer: Self-pay | Admitting: Acute Care

## 2022-12-05 DIAGNOSIS — R911 Solitary pulmonary nodule: Secondary | ICD-10-CM

## 2022-12-05 DIAGNOSIS — Z87891 Personal history of nicotine dependence: Secondary | ICD-10-CM

## 2022-12-05 NOTE — Telephone Encounter (Signed)
Danielle Stuart from [facility]:  IMPRESSION: 1. Lung-RADS 3s, probably benign findings. Short-term follow-up in 6 months is recommended with repeat low-dose chest CT without contrast (please use the following order, "CT CHEST LCS NODULE FOLLOW-UP W/O CM"). 2. The S modifier above refers to new asymmetric soft tissue stranding between the left adrenal gland and upper pole of left kidney, which is centered around an enlarging exophytic cyst upper pole left kidney cyst. Favor hemorrhage or inflammation/infection previously existing simple cyst. Malignancy is considered less favored. Recommend more definitive characterization with renal protocol CT of the abdomen without and with contrast material for more definitive characterization. 3. Coronary artery calcifications. 4. Aortic Atherosclerosis (ICD10-I70.0) and Emphysema (ICD10-J43.9)

## 2022-12-05 NOTE — Telephone Encounter (Signed)
Order placed for 6 mth nodule f/u LCS CT.

## 2022-12-05 NOTE — Telephone Encounter (Signed)
[facility] has call report for NP Groce.

## 2022-12-05 NOTE — Telephone Encounter (Signed)
I have called the patient with the results of her low dose CT Chest. I explained since her last scan there is a new 5.7 mm right lower lobe nodule that we would like to reevaluate in 6 months to ensure that it is stable. Additionally, there was notation of new asymmetric soft tissue stranding between the left adrenal gland and upper pole of the left kidney centered around an enlarging exophytic cyst in the upper pole of the left kidney.  Per radiology this favors hemorrhage or inflammation/infection of a previously existing simple cyst.  They recommend more definitive characterization with a renal protocol CT of the abdomen with and without contrast. I have told the patient I will touch base with Dr. Dorthula Nettles office and let them know the recommendation.  I have asked her to follow-up with his office early next week if she has not heard from them before.  Plan is for low-dose follow-up screening CT in June 2024 which is 6 months. I will call Dr. Dorthula Nettles office and let them know about the renal cyst/inflammation or infection, and about the recommendation from radiology. I spoke with Kennon Rounds, Dr. Carolee Rota nurse. I have faxed the scan results to their office as Kennon Rounds requested.   As a note the patient has not had any renal issues or kidney pain that she can recall. Denise please place order for 94-month low-dose follow-up screening CT and fax results to PCP.  Thanks so much   Lung-RADS 3s, probably benign findings. Short-term follow-up in 6 months is recommended with repeat low-dose chest CT without contrast (please use the following order, "CT CHEST LCS NODULE FOLLOW-UP W/O CM"). 2. The S modifier above refers to new asymmetric soft tissue stranding between the left adrenal gland and upper pole of left kidney, which is centered around an enlarging exophytic cyst upper pole left kidney cyst. Favor hemorrhage or inflammation/infection previously existing simple cyst. Malignancy is considered less favored.  Recommend more definitive characterization with renal protocol CT of the abdomen without and with contrast material for more definitive characterization. 3. Coronary artery calcifications. 4. Aortic Atherosclerosis (ICD10-I70.0) and Emphysema (ICD10-J43.9).

## 2022-12-09 DIAGNOSIS — Z23 Encounter for immunization: Secondary | ICD-10-CM | POA: Diagnosis not present

## 2023-04-06 NOTE — Progress Notes (Unsigned)
Cardiology Office Note   Date:  04/07/2023   ID:  Danielle Stuart, DOB [birth year], MRN [id number]  PCP:  Merri Brunette, MD    No chief complaint on file.  Coronary artery calcification  Wt Readings from Last 3 Encounters:  04/07/23 191 lb 6.4 oz (86.8 kg)  06/21/22 203 lb (92.1 kg)  02/08/22 194 lb 12.8 oz (88.4 kg)       History of Present Illness: Danielle Stuart is a 72 y.o. female  with coronary artery calcification.    SHe had a CT scan in 11/2019.  This showed: "The heart size appears within normal limits. Aortic atherosclerosis. Lad and RCA coronary artery calcifications."   As of 2023:"She gained weight after COVID.  Tries to walk some.  Will do more when the weather is better. "  No really Walking regularly.  Walks a little in the house.  Husband has been ill.  Eating more salt perhaps of late.    Denies : Chest pain. Dizziness. Nitroglycerin use. Orthopnea. Palpitations. Paroxysmal nocturnal dyspnea. Shortness of breath. Syncope.    Leg edema with salt consumption.    Past Medical History:  Diagnosis Date   Allergic rhinitis    Anxiety    Aortic atherosclerosis (HCC)    Atopic dermatitis    Calcific coronary arteriosclerosis    Calculus of kidney    Cardiovascular arteriosclerosis    Cellulitis    Chest pain    Chronic fatigue    Depression with anxiety    Epistaxis    GERD (gastroesophageal reflux disease)    History of colonic polyps    Hot flashes    Hypercholesteremia    Hypothyroidism    Iron deficiency anemia    Leg edema    Obese    Prediabetes    Pulmonary emphysema (HCC)    RLS (restless legs syndrome)    Sleep deprivation    Vitamin D deficiency     Past Surgical History:  Procedure Laterality Date   BREAST SURGERY     1970's- biopsies of breast- benign    CARDIAC CATHETERIZATION  2004   told that the results were normal   CARPAL TUNNEL RELEASE Bilateral    OPEN REDUCTION INTERNAL FIXATION (ORIF) DISTAL RADIAL FRACTURE  Left 08/16/2016   Procedure: OPEN REDUCTION INTERNAL FIXATION (ORIF) LEFT DISTAL RADIAL FRACTURE;  Surgeon: Dairl Ponder, MD;  Location: [facility] OR;  Service: Orthopedics;  Laterality: Left;     Current Outpatient Medications  Medication Sig Dispense Refill   aspirin 81 MG chewable tablet Chew 1 tablet by mouth daily.     cetirizine (ZYRTEC) 10 MG tablet Take 1 tablet by mouth daily.     Cholecalciferol (VITAMIN D3) 50 MCG (2000 UT) TABS Take by mouth.     cycloSPORINE (RESTASIS) 0.05 % ophthalmic emulsion Apply to eye.     furosemide (LASIX) 40 MG tablet Take 40 mg by mouth. Per patient taking 1/2 tab 20 mg     levothyroxine (SYNTHROID, LEVOTHROID) 50 MCG tablet Take 50 mcg by mouth daily.  4   rosuvastatin (CRESTOR) 10 MG tablet Take 10 mg by mouth daily.     sertraline (ZOLOFT) 100 MG tablet Take 100 mg by mouth daily.  0   triamcinolone cream (KENALOG) 0.1 % as needed.     ondansetron (ZOFRAN-ODT) 4 MG disintegrating tablet Take 1 tablet (4 mg total) by mouth every 8 (eight) hours as needed for nausea or vomiting. (Patient not taking: Reported on 04/07/2023) 20  tablet 0   No current facility-administered medications for this visit.    Allergies:   Codeine, Cocaine, and Latex    Social History:  The patient  reports that she quit smoking about 15 years ago. Her smoking use included cigarettes. She has a 38.00 pack-year smoking history. She has never used smokeless tobacco. She reports that she does not drink alcohol.   Family History:  The patient's family history includes CAD in her brother and mother; Diabetes in her mother; Heart attack in her brother; Heart disease in her mother; Heart failure in her father.    ROS:  Please see the history of present illness.   Otherwise, review of systems are positive for lost weight since her husband's illness.   All other systems are reviewed and negative.    PHYSICAL EXAM: VS:  BP (!) 142/60   Pulse 64   Ht  (1.6 m)   Wt 191 lb 6.4 oz  (86.8 kg)   SpO2 93%   BMI 33.90 kg/m  , BMI Body mass index is 33.9 kg/m. GEN: Well nourished, well developed, in no acute distress HEENT: normal Neck: no JVD, carotid bruits, or masses Cardiac: RRR; no murmurs, rubs, or gallops,no edema  Respiratory:  clear to auscultation bilaterally, normal work of breathing GI: soft, nontender, nondistended, + BS, obese MS: no deformity or atrophy Skin: warm and dry, no rash Neuro:  Strength and sensation are intact Psych: euthymic mood, full affect   EKG:   The ekg ordered today demonstrates normal ECG   Recent Labs: 06/21/2022: BUN 10; Creatinine, Ser 0.62; Hemoglobin 12.0; Platelets 201; Potassium 3.7; Sodium 137   Lipid Panel No results found for: "CHOL", "TRIG", "HDL", "CHOLHDL", "VLDL", "LDLCALC", "LDLDIRECT"   Other studies Reviewed: Additional studies/ records that were reviewed today with results demonstrating: LDL 82 in 2022.   ASSESSMENT AND PLAN:  Coronary artery calcification/CAD: Continue aggressive preventive therapy.  No angina. Continue to try to improve lifestyle.   No bleeding on aspirin.    Obesity: Whole food, plant based diet. High fiber diet.  Avoid processed foods.  Hypothyroid: check TSH today.  She wants to be on higher dose of Synthroid if possible. Hyperlipidemia:  Check lipids.  Healthy diet.  No recent lipids available.  Continue rosuvastatin.  Elevated blood sugar: Followed by Dr. Renne Crigler.  Check A1C PACs: She has seen Dr. Ladona Ridgel in the past.  Not bothersome at this time. Lower extremity edema: Has had venous ablation done in the past.  Follows with [facility].    Current medicines are reviewed at length with the patient today.  The patient concerns regarding her medicines were addressed.  The following changes have been made:  No change  Labs/ tests ordered today include: CBC, c-Met, lipids, TSH No orders of the defined types were placed in this encounter.   Recommend 150 minutes/week of aerobic  exercise Low fat, low carb, high fiber diet recommended  Disposition:   FU in 1 year   Signed, Lance Muss, MD  04/07/2023 9:37 AM    [facility] [address] [place], [place], [place]  [address] Phone: [phone]; Fax: [phone]

## 2023-04-07 ENCOUNTER — Ambulatory Visit: Payer: Medicare Other | Attending: Interventional Cardiology | Admitting: Interventional Cardiology

## 2023-04-07 VITALS — BP 142/60 | HR 64 | Ht 63.0 in | Wt 191.4 lb

## 2023-04-07 DIAGNOSIS — I2510 Atherosclerotic heart disease of native coronary artery without angina pectoris: Secondary | ICD-10-CM | POA: Diagnosis not present

## 2023-04-07 DIAGNOSIS — R739 Hyperglycemia, unspecified: Secondary | ICD-10-CM

## 2023-04-07 DIAGNOSIS — E782 Mixed hyperlipidemia: Secondary | ICD-10-CM | POA: Diagnosis not present

## 2023-04-07 DIAGNOSIS — I491 Atrial premature depolarization: Secondary | ICD-10-CM

## 2023-04-07 DIAGNOSIS — E669 Obesity, unspecified: Secondary | ICD-10-CM

## 2023-04-07 NOTE — Patient Instructions (Signed)
Medication Instructions:  Your physician recommends that you continue on your current medications as directed. Please refer to the Current Medication list given to you today.  *If you need a refill on your cardiac medications before your next appointment, please call your pharmacy*   Lab Work: Lab work to be done today--CBC, CMET, Lipids, TSH, A1C If you have labs (blood work) drawn today and your tests are completely normal, you will receive your results only by: MyChart Message (if you have MyChart) OR A paper copy in the mail If you have any lab test that is abnormal or we need to change your treatment, we will call you to review the results.   Testing/Procedures: none   Follow-Up: At [facility], you and your health needs are our priority.  As part of our continuing mission to provide you with exceptional heart care, we have created designated Provider Care Teams.  These Care Teams include your primary Cardiologist (physician) and Advanced Practice Providers (APPs -  Physician Assistants and Nurse Practitioners) who all work together to provide you with the care you need, when you need it.  We recommend signing up for the patient portal called "MyChart".  Sign up information is provided on this After Visit Summary.  MyChart is used to connect with patients for Virtual Visits (Telemedicine).  Patients are able to view lab/test results, encounter notes, upcoming appointments, etc.  Non-urgent messages can be sent to your provider as well.   To learn more about what you can do with MyChart, go to [email or web address].    Your next appointment:   12 month(s)  Provider:   Lance Muss, MD     Other Instructions  Check blood pressure at home. Let us know if it is running high  High-Fiber Eating Plan Fiber, also called dietary fiber, is a type of carbohydrate. It is found foods such as fruits, vegetables, whole grains, and beans. A high-fiber diet can have many health  benefits. Your health care provider may recommend a high-fiber diet to help: Prevent constipation. Fiber can make your bowel movements more regular. Lower your cholesterol. Relieve the following conditions: Inflammation of veins in the anus (hemorrhoids). Inflammation of specific areas of the digestive tract (uncomplicated diverticulosis). A problem of the large intestine, also called the colon, that sometimes causes pain and diarrhea (irritable bowel syndrome, or IBS). Prevent overeating as part of a weight-loss plan. Prevent heart disease, type 2 diabetes, and certain cancers. What are tips for following this plan? Reading food labels  Check the nutrition facts label on food products for the amount of dietary fiber. Choose foods that have 5 grams of fiber or more per serving. The goals for recommended daily fiber intake include: Men (age 36 or younger): 34-38 g. Men (over age 55): 28-34 g. Women (age 6 or younger): 25-28 g. Women (over age 41): 22-25 g. Your daily fiber goal is _____________ g. Shopping Choose whole fruits and vegetables instead of processed forms, such as apple juice or applesauce. Choose a wide variety of high-fiber foods such as avocados, lentils, oats, and kidney beans. Read the nutrition facts label of the foods you choose. Be aware of foods with added fiber. These foods often have high sugar and sodium amounts per serving. Cooking Use whole-grain flour for baking and cooking. Cook with brown rice instead of white rice. Meal planning Start the day with a breakfast that is high in fiber, such as a cereal that contains 5 g of fiber or more per  serving. Eat breads and cereals that are made with whole-grain flour instead of refined flour or white flour. Eat brown rice, bulgur wheat, or millet instead of white rice. Use beans in place of meat in soups, salads, and pasta dishes. Be sure that half of the grains you eat each day are whole grains. General  information You can get the recommended daily intake of dietary fiber by: Eating a variety of fruits, vegetables, grains, nuts, and beans. Taking a fiber supplement if you are not able to take in enough fiber in your diet. It is better to get fiber through food than from a supplement. Gradually increase how much fiber you consume. If you increase your intake of dietary fiber too quickly, you may have bloating, cramping, or gas. Drink plenty of water to help you digest fiber. Choose high-fiber snacks, such as berries, raw vegetables, nuts, and popcorn. What foods should I eat? Fruits Berries. Pears. Apples. Oranges. Avocado. Prunes and raisins. Dried figs. Vegetables Sweet potatoes. Spinach. Kale. Artichokes. Cabbage. Broccoli. Cauliflower. Green peas. Carrots. Squash. Grains Whole-grain breads. Multigrain cereal. Oats and oatmeal. Brown rice. Barley. Bulgur wheat. Millet. Quinoa. Bran muffins. Popcorn. Rye wafer crackers. Meats and other proteins Navy beans, kidney beans, and pinto beans. Soybeans. Split peas. Lentils. Nuts and seeds. Dairy Fiber-fortified yogurt. Beverages Fiber-fortified soy milk. Fiber-fortified orange juice. Other foods Fiber bars. The items listed above may not be a complete list of recommended foods and beverages. Contact a dietitian for more information. What foods should I avoid? Fruits Fruit juice. Cooked, strained fruit. Vegetables Fried potatoes. Canned vegetables. Well-cooked vegetables. Grains White bread. Pasta made with refined flour. White rice. Meats and other proteins Fatty cuts of meat. Fried chicken or fried fish. Dairy Milk. Yogurt. Cream cheese. Sour cream. Fats and oils Butters. Beverages Soft drinks. Other foods Cakes and pastries. The items listed above may not be a complete list of foods and beverages to avoid. Talk with your dietitian about what choices are best for you. Summary Fiber is a type of carbohydrate. It is found in foods  such as fruits, vegetables, whole grains, and beans. A high-fiber diet has many benefits. It can help to prevent constipation, lower blood cholesterol, aid weight loss, and reduce your risk of heart disease, diabetes, and certain cancers. Increase your intake of fiber gradually. Increasing fiber too quickly may cause cramping, bloating, and gas. Drink plenty of water while you increase the amount of fiber you consume. The best sources of fiber include whole fruits and vegetables, whole grains, nuts, seeds, and beans. This information is not intended to replace advice given to you by your health care provider. Make sure you discuss any questions you have with your health care provider. Document Revised: 04/13/2020 Document Reviewed: 04/13/2020 Elsevier Patient Education  2023 [employer].

## 2023-04-08 LAB — COMPREHENSIVE METABOLIC PANEL
ALT: 13 IU/L (ref 0–32)
AST: 18 IU/L (ref 0–40)
Albumin/Globulin Ratio: 1.7 (ref 1.2–2.2)
Albumin: 4.4 g/dL (ref 3.8–4.8)
Alkaline Phosphatase: 111 IU/L (ref 44–121)
BUN/Creatinine Ratio: 13 (ref 12–28)
BUN: 10 mg/dL (ref 8–27)
Bilirubin Total: 0.5 mg/dL (ref 0.0–1.2)
CO2: 28 mmol/L (ref 20–29)
Calcium: 9.6 mg/dL (ref 8.7–10.3)
Chloride: 101 mmol/L (ref 96–106)
Creatinine, Ser: 0.77 mg/dL (ref 0.57–1.00)
Globulin, Total: 2.6 g/dL (ref 1.5–4.5)
Glucose: 102 mg/dL — ABNORMAL HIGH (ref 70–99)
Potassium: 4.3 mmol/L (ref 3.5–5.2)
Sodium: 140 mmol/L (ref 134–144)
Total Protein: 7 g/dL (ref 6.0–8.5)
eGFR: 81 mL/min/{1.73_m2} (ref >59)

## 2023-04-08 LAB — CBC
Hematocrit: 37.2 % (ref 34.0–46.6)
Hemoglobin: 12.1 g/dL (ref 11.1–15.9)
MCH: 29.2 pg (ref 26.6–33.0)
MCHC: 32.5 g/dL (ref 31.5–35.7)
MCV: 90 fL (ref 79–97)
Platelets: 224 10*3/uL (ref 150–450)
RBC: 4.15 x10E6/uL (ref 3.77–5.28)
RDW: 13 % (ref 11.7–15.4)
WBC: 5.7 10*3/uL (ref 3.4–10.8)

## 2023-04-08 LAB — LIPID PANEL
Chol/HDL Ratio: 2.4 ratio (ref 0.0–4.4)
Cholesterol, Total: 147 mg/dL (ref 100–199)
HDL: 62 mg/dL (ref >39)
LDL Chol Calc (NIH): 65 mg/dL (ref 0–99)
Triglycerides: 112 mg/dL (ref 0–149)
VLDL Cholesterol Cal: 20 mg/dL (ref 5–40)

## 2023-04-08 LAB — HEMOGLOBIN A1C
Est. average glucose Bld gHb Est-mCnc: 131 mg/dL
Hgb A1c MFr Bld: 6.2 % — ABNORMAL HIGH (ref 4.8–5.6)

## 2023-04-08 LAB — TSH: TSH: 2.74 u[IU]/mL (ref 0.450–4.500)

## 2023-05-13 DIAGNOSIS — I8312 Varicose veins of left lower extremity with inflammation: Secondary | ICD-10-CM | POA: Diagnosis not present

## 2023-05-13 DIAGNOSIS — I8311 Varicose veins of right lower extremity with inflammation: Secondary | ICD-10-CM | POA: Diagnosis not present

## 2023-05-13 DIAGNOSIS — I83892 Varicose veins of left lower extremities with other complications: Secondary | ICD-10-CM | POA: Diagnosis not present

## 2023-05-13 DIAGNOSIS — M79661 Pain in right lower leg: Secondary | ICD-10-CM | POA: Diagnosis not present

## 2023-05-13 DIAGNOSIS — I83812 Varicose veins of left lower extremities with pain: Secondary | ICD-10-CM | POA: Diagnosis not present

## 2023-05-13 DIAGNOSIS — M7989 Other specified soft tissue disorders: Secondary | ICD-10-CM | POA: Diagnosis not present

## 2023-06-06 DIAGNOSIS — I87393 Chronic venous hypertension (idiopathic) with other complications of bilateral lower extremity: Secondary | ICD-10-CM | POA: Diagnosis not present

## 2023-06-06 DIAGNOSIS — I83891 Varicose veins of right lower extremities with other complications: Secondary | ICD-10-CM | POA: Diagnosis not present

## 2023-06-06 DIAGNOSIS — I8311 Varicose veins of right lower extremity with inflammation: Secondary | ICD-10-CM | POA: Diagnosis not present

## 2023-06-06 DIAGNOSIS — R252 Cramp and spasm: Secondary | ICD-10-CM | POA: Diagnosis not present

## 2023-06-06 DIAGNOSIS — I8312 Varicose veins of left lower extremity with inflammation: Secondary | ICD-10-CM | POA: Diagnosis not present

## 2023-06-13 ENCOUNTER — Ambulatory Visit
Admission: RE | Admit: 2023-06-13 | Discharge: 2023-06-13 | Disposition: A | Discharge status: Home / Self Care | Payer: Medicare Other | Source: Ambulatory Visit | Attending: Internal Medicine | Admitting: Internal Medicine

## 2023-06-13 DIAGNOSIS — Z87891 Personal history of nicotine dependence: Secondary | ICD-10-CM

## 2023-06-13 DIAGNOSIS — R911 Solitary pulmonary nodule: Secondary | ICD-10-CM

## 2023-06-13 DIAGNOSIS — Z0389 Encounter for observation for other suspected diseases and conditions ruled out: Secondary | ICD-10-CM | POA: Diagnosis not present

## 2023-06-17 DIAGNOSIS — I83892 Varicose veins of left lower extremities with other complications: Secondary | ICD-10-CM | POA: Diagnosis not present

## 2023-06-17 DIAGNOSIS — I87392 Chronic venous hypertension (idiopathic) with other complications of left lower extremity: Secondary | ICD-10-CM | POA: Diagnosis not present

## 2023-06-18 ENCOUNTER — Other Ambulatory Visit: Payer: Self-pay

## 2023-06-18 DIAGNOSIS — Z87891 Personal history of nicotine dependence: Secondary | ICD-10-CM

## 2023-06-18 DIAGNOSIS — Z122 Encounter for screening for malignant neoplasm of respiratory organs: Secondary | ICD-10-CM

## 2023-06-23 DIAGNOSIS — I83891 Varicose veins of right lower extremities with other complications: Secondary | ICD-10-CM | POA: Diagnosis not present

## 2023-06-30 DIAGNOSIS — I83892 Varicose veins of left lower extremities with other complications: Secondary | ICD-10-CM | POA: Diagnosis not present

## 2023-06-30 DIAGNOSIS — I83812 Varicose veins of left lower extremities with pain: Secondary | ICD-10-CM | POA: Diagnosis not present

## 2023-07-08 DIAGNOSIS — I83891 Varicose veins of right lower extremities with other complications: Secondary | ICD-10-CM | POA: Diagnosis not present

## 2023-07-17 DIAGNOSIS — E039 Hypothyroidism, unspecified: Secondary | ICD-10-CM | POA: Diagnosis not present

## 2023-07-17 DIAGNOSIS — I2510 Atherosclerotic heart disease of native coronary artery without angina pectoris: Secondary | ICD-10-CM | POA: Diagnosis not present

## 2023-08-08 DIAGNOSIS — H532 Diplopia: Secondary | ICD-10-CM | POA: Diagnosis not present

## 2023-08-08 DIAGNOSIS — H5000 Unspecified esotropia: Secondary | ICD-10-CM | POA: Diagnosis not present

## 2023-08-14 DIAGNOSIS — H5000 Unspecified esotropia: Secondary | ICD-10-CM | POA: Diagnosis not present

## 2023-08-26 DIAGNOSIS — I2510 Atherosclerotic heart disease of native coronary artery without angina pectoris: Secondary | ICD-10-CM | POA: Diagnosis not present

## 2023-08-27 ENCOUNTER — Other Ambulatory Visit: Payer: Self-pay | Admitting: Internal Medicine

## 2023-08-27 DIAGNOSIS — I2510 Atherosclerotic heart disease of native coronary artery without angina pectoris: Secondary | ICD-10-CM

## 2023-09-08 ENCOUNTER — Ambulatory Visit
Admission: RE | Admit: 2023-09-08 | Discharge: 2023-09-08 | Disposition: A | Discharge status: Home / Self Care | Payer: Medicare Other | Source: Ambulatory Visit | Attending: Internal Medicine | Admitting: Internal Medicine

## 2023-09-08 DIAGNOSIS — I2510 Atherosclerotic heart disease of native coronary artery without angina pectoris: Secondary | ICD-10-CM | POA: Diagnosis not present

## 2023-09-13 IMAGING — CT CT CHEST LUNG CANCER SCREENING LOW DOSE W/O CM
2 of 4 series · 15 of 36 positions shown, 18 images · non-contrast
Comparison: Low-dose lung cancer screening chest CT 12/01/2020.

CLINICAL DATA: 70-year-old female former smoker (quit in 2220) with
38 pack-year history of smoking. Lung cancer screening examination.

EXAM:
CT CHEST WITHOUT CONTRAST LOW-DOSE FOR LUNG CANCER SCREENING
TECHNIQUE: Multidetector CT imaging of the chest was performed following the
standard protocol without IV contrast.

[Series 3: lung thins 1.0 · axial · 0.66mm/px · z∈[-368,-81]mm · 12 of 317 slices shown, 15 images]
[im 15/317  mediastinal]
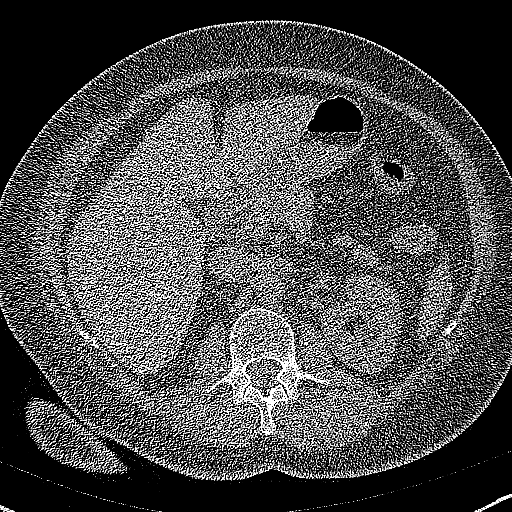
[im 15/317  lung]
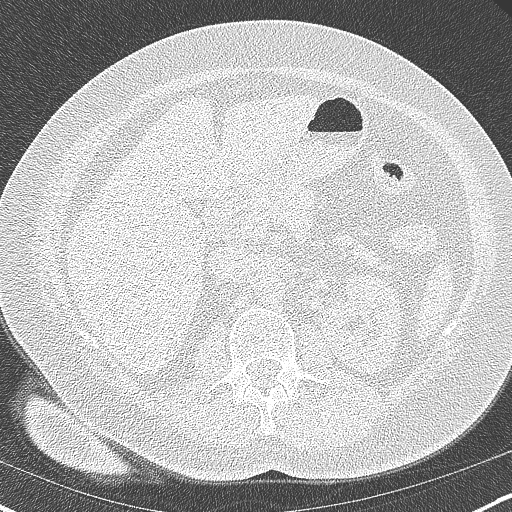
[im 44/317  lung]
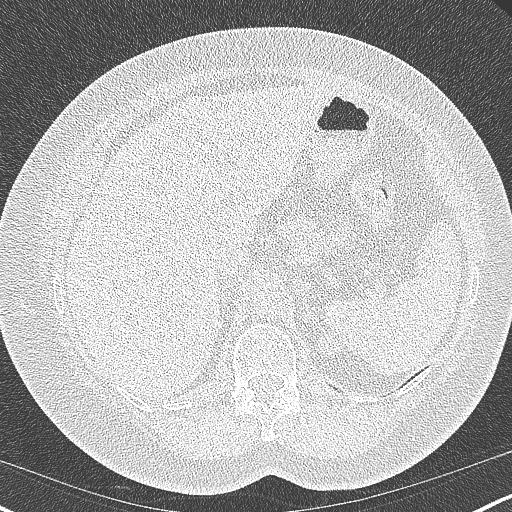
[im 72/317  lung]
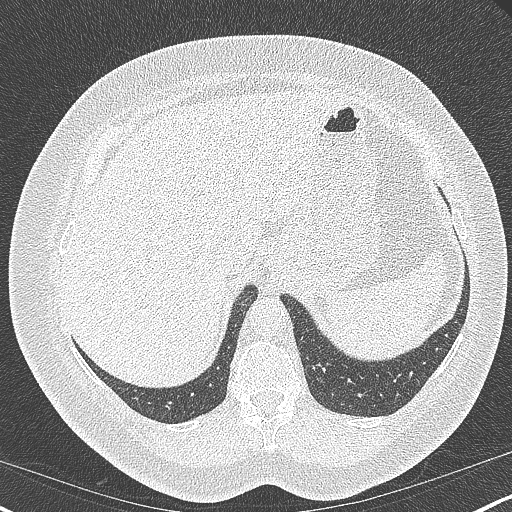
[im 101/317  lung]
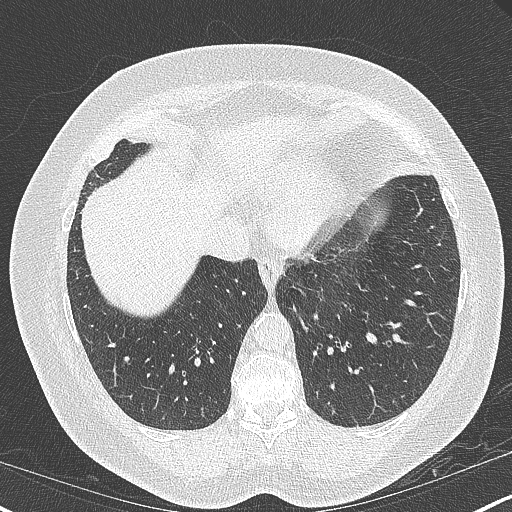
[im 115/317  mediastinal]
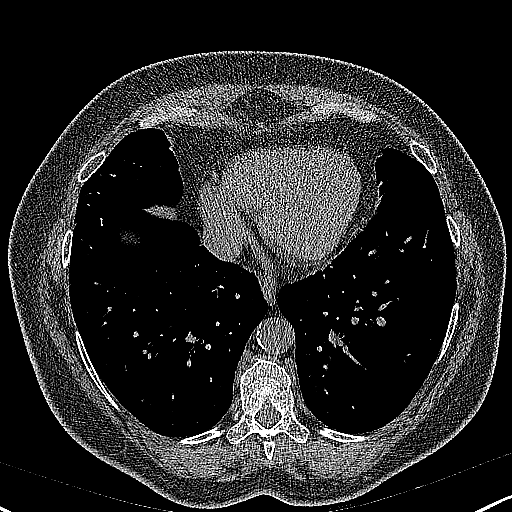
[im 115/317  lung]
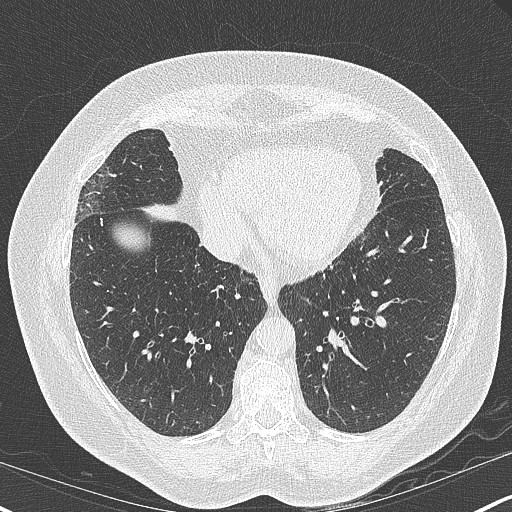
[im 144/317  lung]
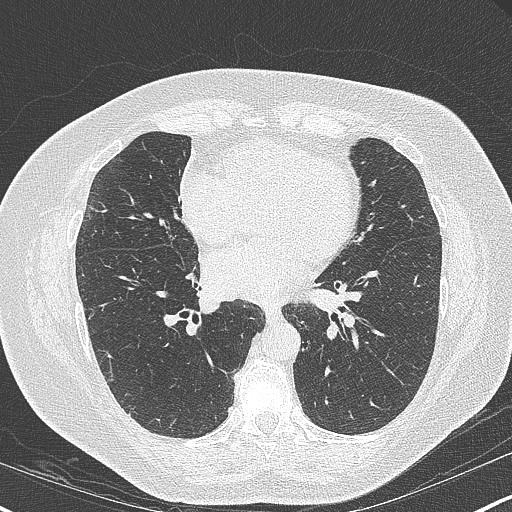
[im 173/317  lung]
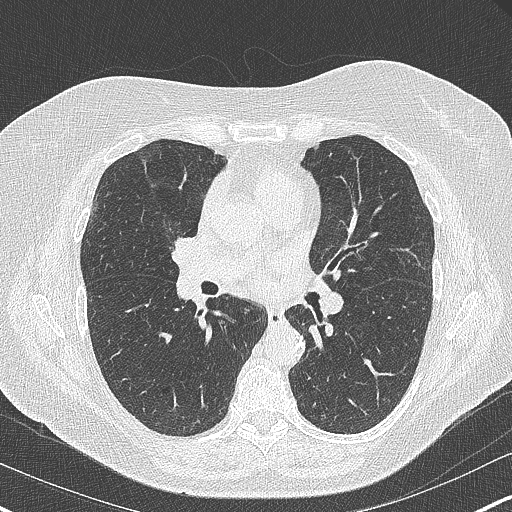
[im 202/317  lung]
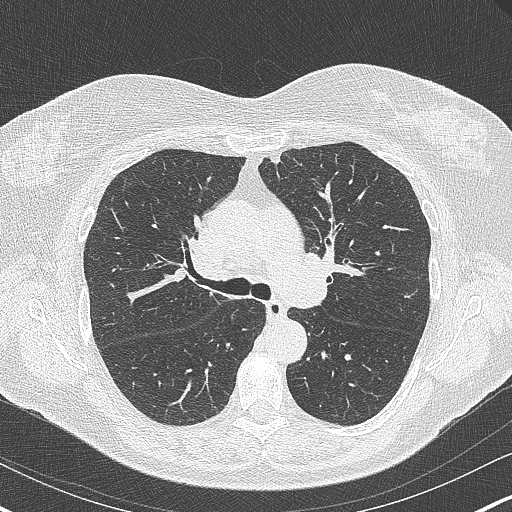
[im 216/317  mediastinal]
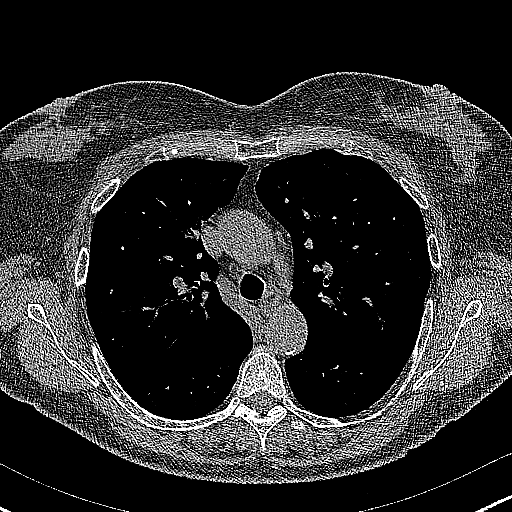
[im 216/317  lung]
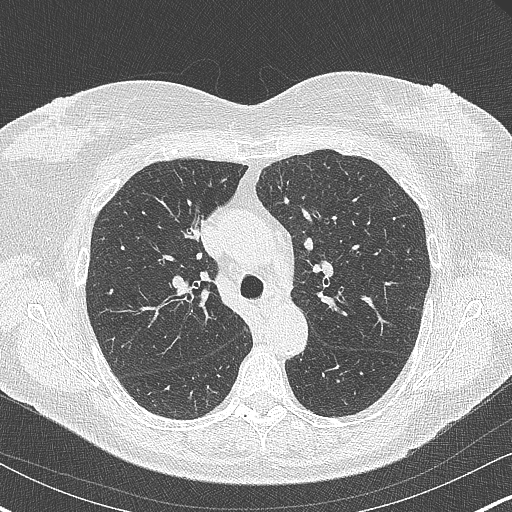
[im 245/317  lung]
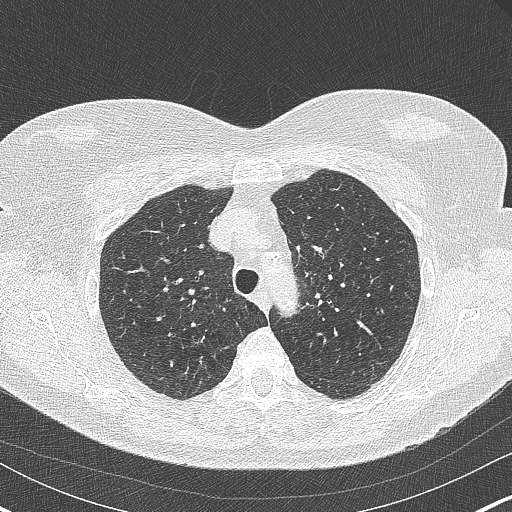
[im 273/317  lung]
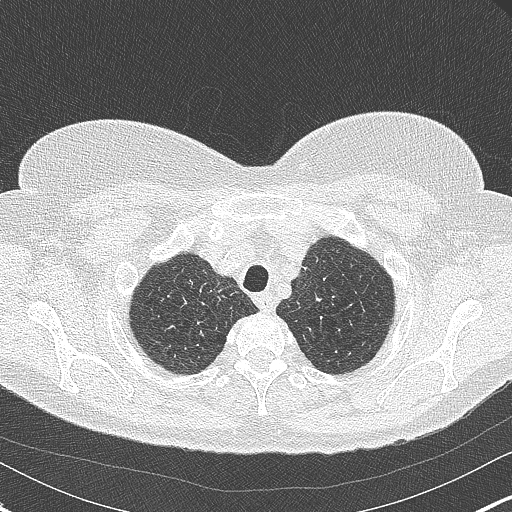
[im 302/317  lung]
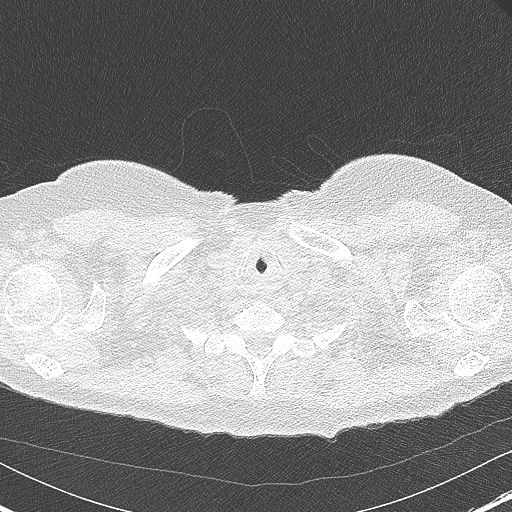

[Series 5: coronal · coronal · 0.62mm/px · 3 of 129 slices shown]
[im 26/129  lung]
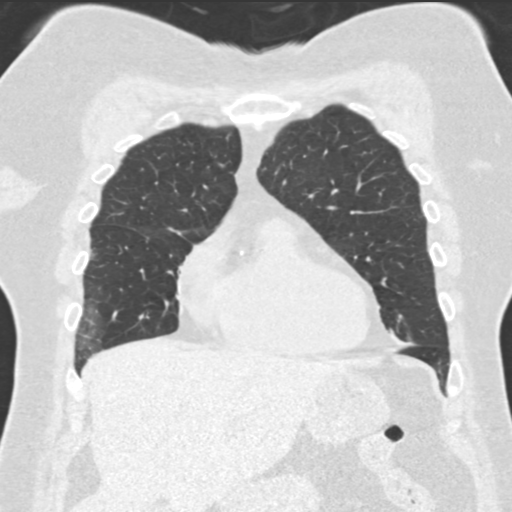
[im 52/129  lung]
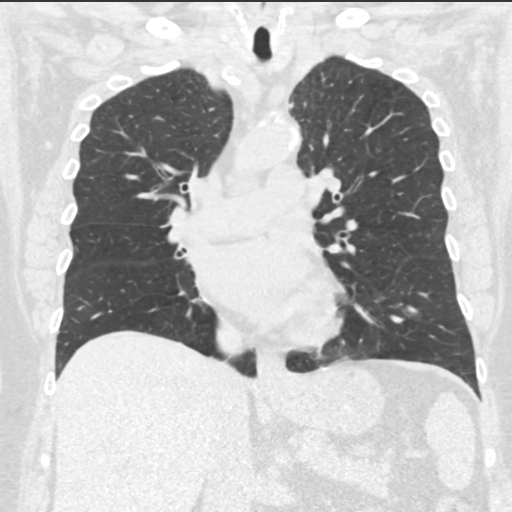
[im 77/129  lung]
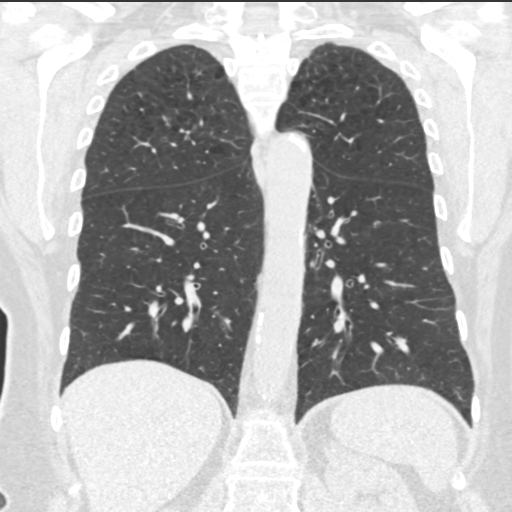

[15 of 36 positions shown; findings below may reference images not displayed]

FINDINGS: Cardiovascular: Heart size is normal. There is no significant
pericardial fluid, thickening or pericardial calcification. There is
aortic atherosclerosis, as well as atherosclerosis of the great
vessels of the mediastinum and the coronary arteries, including
calcified atherosclerotic plaque in the left anterior descending,
left circumflex and right coronary arteries.

Mediastinum/Nodes: No pathologically enlarged mediastinal or hilar
lymph nodes. Please note that accurate exclusion of hilar adenopathy
is limited on noncontrast CT scans. Esophagus is unremarkable in
appearance. No axillary lymphadenopathy.

Lungs/Pleura: Small pulmonary nodules are again noted, largest of
which is in the posteromedial aspect of the left upper lobe (axial
image 64 of series 3), with a volume derived mean diameter of
mm, similar to prior examinations. No other larger more suspicious
appearing pulmonary nodules or masses are noted. No acute
consolidative airspace disease. No pleural effusions. Mild diffuse
bronchial wall thickening with mild centrilobular and paraseptal
emphysema.

Upper Abdomen: Aortic atherosclerosis.

Musculoskeletal: There are no aggressive appearing lytic or blastic
lesions noted in the visualized portions of the skeleton.
IMPRESSION: 1. Lung-RADS 2S, benign appearance or behavior. Continue annual
screening with low-dose chest CT without contrast in 12 months.
2. The "S" modifier above refers to potentially clinically
significant non lung cancer related findings. Specifically, there is
aortic atherosclerosis, in addition to three-vessel coronary artery
disease. Please note that although the presence of coronary artery
calcium documents the presence of coronary artery disease, the
severity of this disease and any potential stenosis cannot be
assessed on this non-gated CT examination. Assessment for potential
risk factor modification, dietary therapy or pharmacologic therapy
may be warranted, if clinically indicated.
3. Mild diffuse bronchial wall thickening with mild centrilobular
and paraseptal emphysema; imaging findings suggestive of underlying
COPD.

Aortic Atherosclerosis (6ZZ2G-FKC.C) and Emphysema (6ZZ2G-5H0.I).

## 2023-09-17 DIAGNOSIS — R61 Generalized hyperhidrosis: Secondary | ICD-10-CM | POA: Diagnosis not present

## 2023-09-17 DIAGNOSIS — R682 Dry mouth, unspecified: Secondary | ICD-10-CM | POA: Diagnosis not present

## 2023-09-17 DIAGNOSIS — R300 Dysuria: Secondary | ICD-10-CM | POA: Diagnosis not present

## 2023-10-09 ENCOUNTER — Encounter: Payer: Self-pay | Admitting: Internal Medicine

## 2023-10-09 ENCOUNTER — Ambulatory Visit: Payer: Medicare Other | Attending: Internal Medicine | Admitting: Internal Medicine

## 2023-10-09 VITALS — BP 134/72 | HR 72 | Resp 16 | Ht 63.0 in | Wt 199.4 lb

## 2023-10-09 DIAGNOSIS — F17201 Nicotine dependence, unspecified, in remission: Secondary | ICD-10-CM | POA: Diagnosis not present

## 2023-10-09 DIAGNOSIS — R011 Cardiac murmur, unspecified: Secondary | ICD-10-CM | POA: Diagnosis not present

## 2023-10-09 DIAGNOSIS — I700 Atherosclerosis of aorta: Secondary | ICD-10-CM

## 2023-10-09 DIAGNOSIS — I2510 Atherosclerotic heart disease of native coronary artery without angina pectoris: Secondary | ICD-10-CM

## 2023-10-09 NOTE — Progress Notes (Signed)
Cardiology Office Note:  .    Date:  10/09/2023  ID:  Danielle Stuart, DOB [birth year], MRN [id number] PCP: Merri Brunette, MD  [facility] Providers Cardiologist:  Lance Muss, MD     CC: Secondary prevention visit.  History of Present Illness: .    Danielle Stuart is a 72 y.o. female with prior tobacco use, HTN, HLD, and CAC who was followed by Dr. Eldridge Dace from 2020 to 2023.   Discussed the use of AI scribe software for clinical note transcription with the patient, who gave verbal consent to proceed.   Danielle Stuart, a 72 year old with a history of coronary artery calcifications, aortic atherosclerosis, and hypothyroidism, has been asymptomatic and managing her conditions primarily through dietary modifications. She has a history of tobacco use, making her eligible for low-dose calcium screening. Her most recent screening revealed dense calcifications in her LAD and RCA.  She has been experiencing weight gain, which she attributes to dietary choices and a decrease in physical activity. She reports a preference for packaged foods and sweets, and acknowledges a decrease in her previous routine of daily exercise. She expresses a desire to lose weight and improve her overall health.  No chest pain or pressure .  No SOB/DOE and no PND/Orthopnea.  No weight gain or leg swelling.  No palpitations or syncope.  She also reports being on sertraline for an extended period and recently had bupropion added to her regimen.  She has a family support system, including a husband with AFib and a grandchild with food allergies. She expresses a desire to increase her physical activity for her mental and physical health.  Relevant histories: .  Social former J[facility] patient, she has a Careers information officer who Shamea when to school with and granddaugther.  ROS: As per HPI.   Studies Reviewed: .   Cardiac Studies & Procedures         MONITORS  LONG TERM MONITOR (3-14 DAYS) 10/22/2018             Physical Exam:    VS:  BP 134/72 (BP Location: Left Arm, Patient Position: Sitting, Cuff Size: Large)   Pulse 72   Resp 16   Ht 5\' 3"  (1.6 m)   Wt 199 lb 6.4 oz (90.4 kg)   SpO2 96%   BMI 35.32 kg/m    Wt Readings from Last 3 Encounters:  10/09/23 199 lb 6.4 oz (90.4 kg)  04/07/23 191 lb 6.4 oz (86.8 kg)  06/21/22 203 lb (92.1 kg)    Gen: no distress   Neck: No JVD Ears: no Homero Fellers Sign Cardiac: No Rubs or Gallops, Soft systolic murmur, RRR +2 radial pulses Respiratory: Clear to auscultation bilaterally, normal effort, normal  respiratory rate GI: Soft, nontender, non-distended  MS: No  edema;  moves all extremities Integument: Skin feels warm Neuro:  At time of evaluation, alert and oriented to person/place/time/situation  Psych: Normal affect, patient feels well   ASSESSMENT AND PLAN: .    Coronary Artery Calcification and Aortic Atherosclerosis HLD Systolic heart murmur (new) - Asymptomatic with good control of risk factors. Blood pressure and cholesterol levels are well managed. Noted a new heart murmur suggestive of tricuspid regurgitation, possibly related to past smoking history. -Due to cost, we have held off TTE for this visit, if worsening or new SOB,will get echo. -Continue current medications and lifestyle modifications.  Obesity - Patient expresses dissatisfaction with current weight and desire to lose weight. History of consuming packaged foods and limited physical  activity. -Encourage increased physical activity, such as walking. -Recommend dietary changes, including home-cooked meals and reduction in packaged food consumption. -Provide resources for meal planning and preparation.  Hypothyroidism - Managed by Dr. Norma Fredrickson, who has been adjusting Synthroid dosage. -Continue current management with Dr. Norma Fredrickson.  Tobacco Use - Distant history of 38 pack-years. -Continue to abstain from smoking. -Continue low-dose calcium screening for lung malignancy as per  PCP    Riley Lam, MD FASE [facility] Cardiologist [facility]  [address] [place], [place] [address] [phone]  9:57 AM

## 2023-10-09 NOTE — Patient Instructions (Signed)
Medication Instructions:  Your physician recommends that you continue on your current medications as directed. Please refer to the Current Medication list given to you today.  *If you need a refill on your cardiac medications before your next appointment, please call your pharmacy*   Lab Work: NONE If you have labs (blood work) drawn today and your tests are completely normal, you will receive your results only by: MyChart Message (if you have MyChart) OR A paper copy in the mail If you have any lab test that is abnormal or we need to change your treatment, we will call you to review the results.   Testing/Procedures: NONE   Follow-Up: At [facility], you and your health needs are our priority.  As part of our continuing mission to provide you with exceptional heart care, we have created designated Provider Care Teams.  These Care Teams include your primary Cardiologist (physician) and Advanced Practice Providers (APPs -  Physician Assistants and Nurse Practitioners) who all work together to provide you with the care you need, when you need it.    Your next appointment:   1 year(s)  Provider:   Riley Lam, MD

## 2023-11-04 DIAGNOSIS — E7800 Pure hypercholesterolemia, unspecified: Secondary | ICD-10-CM | POA: Diagnosis not present

## 2023-11-07 DIAGNOSIS — K219 Gastro-esophageal reflux disease without esophagitis: Secondary | ICD-10-CM | POA: Diagnosis not present

## 2023-11-07 DIAGNOSIS — E039 Hypothyroidism, unspecified: Secondary | ICD-10-CM | POA: Diagnosis not present

## 2023-11-07 DIAGNOSIS — N3000 Acute cystitis without hematuria: Secondary | ICD-10-CM | POA: Diagnosis not present

## 2023-11-07 DIAGNOSIS — R350 Frequency of micturition: Secondary | ICD-10-CM | POA: Diagnosis not present

## 2023-11-07 DIAGNOSIS — M810 Age-related osteoporosis without current pathological fracture: Secondary | ICD-10-CM | POA: Diagnosis not present

## 2023-11-07 DIAGNOSIS — I2510 Atherosclerotic heart disease of native coronary artery without angina pectoris: Secondary | ICD-10-CM | POA: Diagnosis not present

## 2023-11-07 DIAGNOSIS — Z23 Encounter for immunization: Secondary | ICD-10-CM | POA: Diagnosis not present

## 2023-11-07 DIAGNOSIS — J439 Emphysema, unspecified: Secondary | ICD-10-CM | POA: Diagnosis not present

## 2023-11-07 DIAGNOSIS — R600 Localized edema: Secondary | ICD-10-CM | POA: Diagnosis not present

## 2023-11-07 DIAGNOSIS — I700 Atherosclerosis of aorta: Secondary | ICD-10-CM | POA: Diagnosis not present

## 2023-11-07 DIAGNOSIS — Z0000 Encounter for general adult medical examination without abnormal findings: Secondary | ICD-10-CM | POA: Diagnosis not present

## 2023-11-11 DIAGNOSIS — I872 Venous insufficiency (chronic) (peripheral): Secondary | ICD-10-CM | POA: Diagnosis not present

## 2023-11-11 DIAGNOSIS — R600 Localized edema: Secondary | ICD-10-CM | POA: Diagnosis not present

## 2023-11-11 DIAGNOSIS — I8312 Varicose veins of left lower extremity with inflammation: Secondary | ICD-10-CM | POA: Diagnosis not present

## 2023-11-11 DIAGNOSIS — I83893 Varicose veins of bilateral lower extremities with other complications: Secondary | ICD-10-CM | POA: Diagnosis not present

## 2023-11-11 DIAGNOSIS — I8311 Varicose veins of right lower extremity with inflammation: Secondary | ICD-10-CM | POA: Diagnosis not present

## 2023-11-30 DIAGNOSIS — Z1212 Encounter for screening for malignant neoplasm of rectum: Secondary | ICD-10-CM | POA: Diagnosis not present

## 2023-11-30 DIAGNOSIS — Z1211 Encounter for screening for malignant neoplasm of colon: Secondary | ICD-10-CM | POA: Diagnosis not present

## 2023-12-02 DIAGNOSIS — I83891 Varicose veins of right lower extremities with other complications: Secondary | ICD-10-CM | POA: Diagnosis not present

## 2023-12-03 DIAGNOSIS — I83892 Varicose veins of left lower extremities with other complications: Secondary | ICD-10-CM | POA: Diagnosis not present

## 2023-12-09 DIAGNOSIS — H52203 Unspecified astigmatism, bilateral: Secondary | ICD-10-CM | POA: Diagnosis not present

## 2023-12-09 DIAGNOSIS — H26492 Other secondary cataract, left eye: Secondary | ICD-10-CM | POA: Diagnosis not present

## 2023-12-09 DIAGNOSIS — H5000 Unspecified esotropia: Secondary | ICD-10-CM | POA: Diagnosis not present

## 2023-12-23 DIAGNOSIS — I83892 Varicose veins of left lower extremities with other complications: Secondary | ICD-10-CM | POA: Diagnosis not present

## 2024-01-07 DIAGNOSIS — I83892 Varicose veins of left lower extremities with other complications: Secondary | ICD-10-CM | POA: Diagnosis not present

## 2024-01-07 DIAGNOSIS — I87392 Chronic venous hypertension (idiopathic) with other complications of left lower extremity: Secondary | ICD-10-CM | POA: Diagnosis not present

## 2024-01-23 DIAGNOSIS — L308 Other specified dermatitis: Secondary | ICD-10-CM | POA: Diagnosis not present

## 2024-01-23 DIAGNOSIS — L258 Unspecified contact dermatitis due to other agents: Secondary | ICD-10-CM | POA: Diagnosis not present

## 2024-04-21 DIAGNOSIS — J40 Bronchitis, not specified as acute or chronic: Secondary | ICD-10-CM | POA: Diagnosis not present

## 2024-04-21 DIAGNOSIS — J040 Acute laryngitis: Secondary | ICD-10-CM | POA: Diagnosis not present

## 2024-05-10 DIAGNOSIS — R7303 Prediabetes: Secondary | ICD-10-CM | POA: Diagnosis not present

## 2024-05-10 DIAGNOSIS — E039 Hypothyroidism, unspecified: Secondary | ICD-10-CM | POA: Diagnosis not present

## 2024-05-10 DIAGNOSIS — I700 Atherosclerosis of aorta: Secondary | ICD-10-CM | POA: Diagnosis not present

## 2024-05-11 DIAGNOSIS — E039 Hypothyroidism, unspecified: Secondary | ICD-10-CM | POA: Diagnosis not present

## 2024-06-11 ENCOUNTER — Other Ambulatory Visit: Payer: Self-pay | Admitting: Acute Care

## 2024-06-11 DIAGNOSIS — Z87891 Personal history of nicotine dependence: Secondary | ICD-10-CM

## 2024-06-11 DIAGNOSIS — Z122 Encounter for screening for malignant neoplasm of respiratory organs: Secondary | ICD-10-CM

## 2024-06-24 ENCOUNTER — Telehealth: Payer: Self-pay | Admitting: Acute Care

## 2024-06-24 DIAGNOSIS — Z87891 Personal history of nicotine dependence: Secondary | ICD-10-CM

## 2024-06-24 DIAGNOSIS — Z122 Encounter for screening for malignant neoplasm of respiratory organs: Secondary | ICD-10-CM

## 2024-06-24 NOTE — Telephone Encounter (Signed)
 Returned call from VM to schedule annual LDCT>  no answer.  Left vm to return call to [phone] or [phone] for appointment

## 2024-07-06 ENCOUNTER — Inpatient Hospital Stay
Admission: RE | Admit: 2024-07-06 | Discharge: 2024-07-06 | Disposition: A | Discharge status: Home / Self Care | Source: Ambulatory Visit | Attending: Acute Care | Admitting: Acute Care

## 2024-07-06 DIAGNOSIS — Z122 Encounter for screening for malignant neoplasm of respiratory organs: Secondary | ICD-10-CM | POA: Diagnosis not present

## 2024-07-06 DIAGNOSIS — Z87891 Personal history of nicotine dependence: Secondary | ICD-10-CM

## 2024-10-12 ENCOUNTER — Encounter (HOSPITAL_BASED_OUTPATIENT_CLINIC_OR_DEPARTMENT_OTHER): Payer: Self-pay

## 2024-10-14 ENCOUNTER — Ambulatory Visit: Attending: Internal Medicine | Admitting: Internal Medicine

## 2024-10-14 VITALS — BP 135/76 | HR 68 | Ht 63.0 in | Wt 204.0 lb

## 2024-10-14 DIAGNOSIS — I700 Atherosclerosis of aorta: Secondary | ICD-10-CM

## 2024-10-14 DIAGNOSIS — I491 Atrial premature depolarization: Secondary | ICD-10-CM | POA: Diagnosis not present

## 2024-10-14 DIAGNOSIS — R011 Cardiac murmur, unspecified: Secondary | ICD-10-CM

## 2024-10-14 DIAGNOSIS — I2510 Atherosclerotic heart disease of native coronary artery without angina pectoris: Secondary | ICD-10-CM

## 2024-10-14 DIAGNOSIS — E782 Mixed hyperlipidemia: Secondary | ICD-10-CM

## 2024-10-14 NOTE — Patient Instructions (Signed)
   Testing/Procedures:  Your physician has requested that you have an echocardiogram. Echocardiography is a painless test that uses sound waves to create images of your heart. It provides your doctor with information about the size and shape of your heart and how well your heart's chambers and valves are working. This procedure takes approximately one hour. There are no restrictions for this procedure. Please do NOT wear cologne, perfume, aftershave, or lotions (deodorant is allowed). Please arrive 15 minutes prior to your appointment time.  Please note: We ask at that you not bring children with you during ultrasound (echo/ vascular) testing. Due to room size and safety concerns, children are not allowed in the ultrasound rooms during exams. Our front office staff cannot provide observation of children in our lobby area while testing is being conducted. An adult accompanying a patient to their appointment will only be allowed in the ultrasound room at the discretion of the ultrasound technician under special circumstances. We apologize for any inconvenience. [address]  Follow-Up: At [facility], you and your health needs are our priority.  As part of our continuing mission to provide you with exceptional heart care, our providers are all part of one team.  This team includes your primary Cardiologist (physician) and Advanced Practice Providers or APPs (Physician Assistants and Nurse Practitioners) who all work together to provide you with the care you need, when you need it.  Your next appointment:   12 month(s)  Provider:   DR SANTO

## 2024-10-14 NOTE — Progress Notes (Signed)
 Cardiology Office Note:  .    Date:  10/14/2024  ID:  Danielle Stuart, DOB [birth year], MRN [id number] PCP: [name], MD  [facility] Providers Cardiologist:  Candyce Reek, MD     CC: Secondary prevention visit.  History of Present Illness: .    Danielle Stuart is a 73 y.o. female with prior tobacco use, HTN, HLD, and CAC who was followed by Dr. Reek from 2020 to 2023.    Danielle Stuart is a 73 year old female with a systolic heart murmur who presents for follow-up of her heart murmur and CAC.  She has a history of a systolic heart murmur, which was noted to be new at her last visit. She deferred further testing at that time due to cost concerns. No symptoms such as shortness of breath, syncope, or palpitations. She takes a half dose of furosemide for leg swelling, which is present.  She has coronary artery calcification and hyperlipidemia. Her cholesterol levels are well controlled, and she is on Crestor 10 mg. She is also on aspirin 1 mg and a low dose diuretic therapy. No symptoms such as chest pain, pressure, tightness, or stinging.  She has a history of hypothyroidism managed by Dr. Andree. She has a former tobacco use history, having quit smoking in 2009 after smoking for many years. No current smoking.  She mentions concerns about her weight, stating 'I've never been this big' and expressing embarrassment about her current size. She acknowledges eating the wrong things and not moving enough, despite walking and moving regularly.  Relevant histories: .  Social former JV patient, she has a Careers information officer who Danielle Stuart when to school with and granddaugther.  ROS: As per HPI.   Studies Reviewed: .   Cardiac Studies & Procedures   ______________________________________________________________________________________________        MONITORS  LONG TERM MONITOR (3-14 DAYS) 10/22/2018        ______________________________________________________________________________________________       Physical Exam:    VS:  BP 135/76   Pulse 68   Ht 5' 3 (1.6 m)   Wt 204 lb (92.5 kg)   SpO2 95%   BMI 36.14 kg/m    Wt Readings from Last 3 Encounters:  10/14/24 204 lb (92.5 kg)  10/09/23 199 lb 6.4 oz (90.4 kg)  04/07/23 191 lb 6.4 oz (86.8 kg)    Gen: no distress   Neck: No JVD Ears: no Dempsey Sign Cardiac: No Rubs or Gallops, Systolic murmur, RRR +2 radial pulses Respiratory: Clear to auscultation bilaterally, normal effort, normal  respiratory rate GI: Soft, nontender, non-distended  MS: No  edema;  moves all extremities Integument: Skin feels warm Neuro:  At time of evaluation, alert and oriented to person/place/time/situation  Psych: Normal affect, patient feels well   ASSESSMENT AND PLAN: .    Coronary Artery Calcification and Aortic Atherosclerosis HLD - LDL at goal; no change in therapy planned  Systolic heart murmur, likely tricuspid regurgitation Systolic heart murmur, likely tricuspid regurgitation, remains asymptomatic without shortness of breath or syncope. Financial constraints previously delayed evaluation, but insurance should cover echocardiogram with minimal copay. Evaluating the murmur is important to prevent future complications. - Order echocardiogram to evaluate systolic heart murmur  Obesity - no clear GLP-1A coverage indication in my review, has had issues with financial coverage  Former Tobacco Use - Distant history of 38 pack-years. -Continue to abstain from smoking. -Continue low-dose calcium screening for lung malignancy as per PCP  One year with me  or my team.  Longitudinal care: The evaluation and management services provided today reflect the complexity inherent in caring for this patient, including the ongoing longitudinal relationship and management of multiple chronic conditions and/or the need for care coordination. The visit  required a comprehensive assessment and management plan tailored to the patient's unique needs Time was spent addressing not only the acute concerns but also the broader context of the patient's health, including preventive care, chronic disease management, and care coordination as appropriate.  Complex longitudinal is necessary for conditions including: long term prevention of obstructive CAD dealing with financial constraints    Stanly Leavens, MD FASE [facility] Cardiologist [facility]  [address] [place], [address] [place], [place] [address] [phone]  10:07 AM

## 2024-12-07 ENCOUNTER — Ambulatory Visit (HOSPITAL_COMMUNITY): Admission: RE | Admit: 2024-12-07 | Discharge: 2024-12-07 | Attending: Internal Medicine | Admitting: Internal Medicine

## 2024-12-07 DIAGNOSIS — R011 Cardiac murmur, unspecified: Secondary | ICD-10-CM

## 2024-12-07 LAB — ECHOCARDIOGRAM COMPLETE
Area-P 1/2: 3.7 cm2
P 1/2 time: 297 ms
S' Lateral: 2.5 cm

## 2024-12-14 ENCOUNTER — Ambulatory Visit: Payer: Self-pay
